# Patient Record
Sex: Female | Born: 1940 | Race: White | Hispanic: No | Marital: Single | Smoking: Former smoker
Health system: Southern US, Community
[De-identification: ages and names within clinical notes are randomized; demographics above are authoritative.]

## PROBLEM LIST (undated history)

## (undated) DIAGNOSIS — R258 Other abnormal involuntary movements: Secondary | ICD-10-CM

## (undated) DIAGNOSIS — R519 Headache, unspecified: Secondary | ICD-10-CM

## (undated) DIAGNOSIS — M79606 Pain in leg, unspecified: Secondary | ICD-10-CM

## (undated) DIAGNOSIS — R51 Headache: Secondary | ICD-10-CM

## (undated) DIAGNOSIS — E8770 Fluid overload, unspecified: Secondary | ICD-10-CM

## (undated) DIAGNOSIS — Z972 Presence of dental prosthetic device (complete) (partial): Secondary | ICD-10-CM

## (undated) DIAGNOSIS — I509 Heart failure, unspecified: Secondary | ICD-10-CM

## (undated) DIAGNOSIS — I10 Essential (primary) hypertension: Secondary | ICD-10-CM

## (undated) DIAGNOSIS — G4730 Sleep apnea, unspecified: Secondary | ICD-10-CM

## (undated) HISTORY — PX: ABDOMINAL HYSTERECTOMY: SHX81

## (undated) HISTORY — PX: COLONOSCOPY: SHX174

## (undated) HISTORY — PX: CATARACT EXTRACTION W/ INTRAOCULAR LENS  IMPLANT, BILATERAL: SHX1307

---

## 2018-03-24 ENCOUNTER — Encounter (HOSPITAL_COMMUNITY): Payer: Self-pay | Admitting: Internal Medicine

## 2018-03-24 ENCOUNTER — Ambulatory Visit (HOSPITAL_BASED_OUTPATIENT_CLINIC_OR_DEPARTMENT_OTHER)
Admission: RE | Admit: 2018-03-24 | Discharge: 2018-03-24 | Disposition: A | Discharge status: Home / Self Care | Payer: Medicare PPO | Source: Ambulatory Visit | Attending: Internal Medicine | Admitting: Internal Medicine

## 2018-03-24 ENCOUNTER — Inpatient Hospital Stay: Payer: Self-pay

## 2018-03-24 ENCOUNTER — Inpatient Hospital Stay (HOSPITAL_COMMUNITY)
Admission: AD | Admit: 2018-03-24 | Discharge: 2018-04-01 | DRG: 246 | Disposition: A | Discharge status: Home Health | Payer: Medicare PPO | Source: Ambulatory Visit | Attending: Internal Medicine | Admitting: Internal Medicine

## 2018-03-24 ENCOUNTER — Other Ambulatory Visit: Payer: Self-pay

## 2018-03-24 ENCOUNTER — Inpatient Hospital Stay (HOSPITAL_COMMUNITY): Payer: Medicare PPO

## 2018-03-24 VITALS — BP 176/94 | HR 60 | Wt 153.0 lb

## 2018-03-24 DIAGNOSIS — G253 Myoclonus: Secondary | ICD-10-CM | POA: Diagnosis not present

## 2018-03-24 DIAGNOSIS — R0600 Dyspnea, unspecified: Secondary | ICD-10-CM

## 2018-03-24 DIAGNOSIS — G255 Other chorea: Secondary | ICD-10-CM | POA: Diagnosis not present

## 2018-03-24 DIAGNOSIS — I272 Pulmonary hypertension, unspecified: Secondary | ICD-10-CM | POA: Diagnosis present

## 2018-03-24 DIAGNOSIS — E538 Deficiency of other specified B group vitamins: Secondary | ICD-10-CM | POA: Diagnosis not present

## 2018-03-24 DIAGNOSIS — I081 Rheumatic disorders of both mitral and tricuspid valves: Secondary | ICD-10-CM | POA: Diagnosis present

## 2018-03-24 DIAGNOSIS — G43909 Migraine, unspecified, not intractable, without status migrainosus: Secondary | ICD-10-CM | POA: Diagnosis present

## 2018-03-24 DIAGNOSIS — I5023 Acute on chronic systolic (congestive) heart failure: Secondary | ICD-10-CM | POA: Diagnosis present

## 2018-03-24 DIAGNOSIS — R0602 Shortness of breath: Secondary | ICD-10-CM

## 2018-03-24 DIAGNOSIS — I2510 Atherosclerotic heart disease of native coronary artery without angina pectoris: Secondary | ICD-10-CM | POA: Diagnosis not present

## 2018-03-24 DIAGNOSIS — L930 Discoid lupus erythematosus: Secondary | ICD-10-CM | POA: Diagnosis present

## 2018-03-24 DIAGNOSIS — R0603 Acute respiratory distress: Secondary | ICD-10-CM

## 2018-03-24 DIAGNOSIS — R768 Other specified abnormal immunological findings in serum: Secondary | ICD-10-CM | POA: Diagnosis present

## 2018-03-24 DIAGNOSIS — G40909 Epilepsy, unspecified, not intractable, without status epilepticus: Secondary | ICD-10-CM | POA: Diagnosis present

## 2018-03-24 DIAGNOSIS — I671 Cerebral aneurysm, nonruptured: Secondary | ICD-10-CM | POA: Diagnosis not present

## 2018-03-24 DIAGNOSIS — I429 Cardiomyopathy, unspecified: Secondary | ICD-10-CM | POA: Diagnosis not present

## 2018-03-24 DIAGNOSIS — I447 Left bundle-branch block, unspecified: Secondary | ICD-10-CM | POA: Diagnosis present

## 2018-03-24 DIAGNOSIS — G4733 Obstructive sleep apnea (adult) (pediatric): Secondary | ICD-10-CM | POA: Diagnosis present

## 2018-03-24 DIAGNOSIS — R259 Unspecified abnormal involuntary movements: Secondary | ICD-10-CM | POA: Diagnosis not present

## 2018-03-24 DIAGNOSIS — I480 Paroxysmal atrial fibrillation: Secondary | ICD-10-CM | POA: Diagnosis not present

## 2018-03-24 DIAGNOSIS — N179 Acute kidney failure, unspecified: Secondary | ICD-10-CM | POA: Diagnosis not present

## 2018-03-24 DIAGNOSIS — I10 Essential (primary) hypertension: Secondary | ICD-10-CM

## 2018-03-24 DIAGNOSIS — Z9989 Dependence on other enabling machines and devices: Secondary | ICD-10-CM

## 2018-03-24 DIAGNOSIS — I6523 Occlusion and stenosis of bilateral carotid arteries: Secondary | ICD-10-CM | POA: Diagnosis present

## 2018-03-24 DIAGNOSIS — T508X5A Adverse effect of diagnostic agents, initial encounter: Secondary | ICD-10-CM | POA: Diagnosis not present

## 2018-03-24 DIAGNOSIS — I6381 Other cerebral infarction due to occlusion or stenosis of small artery: Secondary | ICD-10-CM | POA: Diagnosis not present

## 2018-03-24 DIAGNOSIS — Z79899 Other long term (current) drug therapy: Secondary | ICD-10-CM

## 2018-03-24 DIAGNOSIS — Z955 Presence of coronary angioplasty implant and graft: Secondary | ICD-10-CM

## 2018-03-24 DIAGNOSIS — R29700 NIHSS score 0: Secondary | ICD-10-CM | POA: Diagnosis not present

## 2018-03-24 DIAGNOSIS — I340 Nonrheumatic mitral (valve) insufficiency: Secondary | ICD-10-CM | POA: Diagnosis not present

## 2018-03-24 DIAGNOSIS — R21 Rash and other nonspecific skin eruption: Secondary | ICD-10-CM | POA: Diagnosis present

## 2018-03-24 DIAGNOSIS — I5082 Biventricular heart failure: Secondary | ICD-10-CM | POA: Diagnosis present

## 2018-03-24 DIAGNOSIS — T420X5A Adverse effect of hydantoin derivatives, initial encounter: Secondary | ICD-10-CM | POA: Diagnosis not present

## 2018-03-24 DIAGNOSIS — I110 Hypertensive heart disease with heart failure: Secondary | ICD-10-CM | POA: Diagnosis present

## 2018-03-24 DIAGNOSIS — I639 Cerebral infarction, unspecified: Secondary | ICD-10-CM | POA: Diagnosis not present

## 2018-03-24 DIAGNOSIS — I5022 Chronic systolic (congestive) heart failure: Secondary | ICD-10-CM

## 2018-03-24 DIAGNOSIS — G254 Drug-induced chorea: Secondary | ICD-10-CM | POA: Diagnosis not present

## 2018-03-24 DIAGNOSIS — E871 Hypo-osmolality and hyponatremia: Secondary | ICD-10-CM | POA: Diagnosis present

## 2018-03-24 DIAGNOSIS — Z87891 Personal history of nicotine dependence: Secondary | ICD-10-CM

## 2018-03-24 HISTORY — DX: Fluid overload, unspecified: E87.70

## 2018-03-24 HISTORY — DX: Heart failure, unspecified: I50.9

## 2018-03-24 HISTORY — DX: Other abnormal involuntary movements: R25.8

## 2018-03-24 HISTORY — DX: Essential (primary) hypertension: I10

## 2018-03-24 HISTORY — DX: Pain in leg, unspecified: M79.606

## 2018-03-24 LAB — TSH: TSH: 1.488 u[IU]/mL (ref 0.350–4.500)

## 2018-03-24 LAB — COMPREHENSIVE METABOLIC PANEL
ALK PHOS: 162 U/L — AB (ref 38–126)
ALT: 21 U/L (ref 0–44)
ANION GAP: 8 (ref 5–15)
AST: 40 U/L (ref 15–41)
Albumin: 3.1 g/dL — ABNORMAL LOW (ref 3.5–5.0)
BUN: 12 mg/dL (ref 8–23)
CALCIUM: 8.3 mg/dL — AB (ref 8.9–10.3)
CO2: 26 mmol/L (ref 22–32)
CREATININE: 0.72 mg/dL (ref 0.44–1.00)
Chloride: 99 mmol/L (ref 98–111)
Glucose, Bld: 121 mg/dL — ABNORMAL HIGH (ref 70–99)
Potassium: 3.9 mmol/L (ref 3.5–5.1)
SODIUM: 133 mmol/L — AB (ref 135–145)
TOTAL PROTEIN: 7 g/dL (ref 6.5–8.1)
Total Bilirubin: 0.8 mg/dL (ref 0.3–1.2)

## 2018-03-24 LAB — CBC WITH DIFFERENTIAL/PLATELET
Abs Immature Granulocytes: 0 10*3/uL (ref 0.0–0.1)
BASOS ABS: 0 10*3/uL (ref 0.0–0.1)
BASOS PCT: 0 %
EOS PCT: 1 %
Eosinophils Absolute: 0 10*3/uL (ref 0.0–0.7)
HCT: 37.5 % (ref 36.0–46.0)
HEMOGLOBIN: 12.2 g/dL (ref 12.0–15.0)
Immature Granulocytes: 0 %
LYMPHS PCT: 22 %
Lymphs Abs: 0.6 10*3/uL — ABNORMAL LOW (ref 0.7–4.0)
MCH: 32.9 pg (ref 26.0–34.0)
MCHC: 32.5 g/dL (ref 30.0–36.0)
MCV: 101.1 fL — AB (ref 78.0–100.0)
MONOS PCT: 19 %
Monocytes Absolute: 0.6 10*3/uL (ref 0.1–1.0)
Neutro Abs: 1.7 10*3/uL (ref 1.7–7.7)
Neutrophils Relative %: 58 %
Platelets: 174 10*3/uL (ref 150–400)
RBC: 3.71 MIL/uL — ABNORMAL LOW (ref 3.87–5.11)
RDW: 15.1 % (ref 11.5–15.5)
WBC: 2.9 10*3/uL — ABNORMAL LOW (ref 4.0–10.5)

## 2018-03-24 LAB — TROPONIN I
Troponin I: <0.03 ng/mL (ref <0.03)
Troponin I: <0.03 ng/mL (ref <0.03)

## 2018-03-24 LAB — BRAIN NATRIURETIC PEPTIDE: B NATRIURETIC PEPTIDE 5: 2425.8 pg/mL — AB (ref 0.0–100.0)

## 2018-03-24 LAB — MAGNESIUM: MAGNESIUM: 1.9 mg/dL (ref 1.7–2.4)

## 2018-03-24 LAB — MRSA PCR SCREENING: MRSA BY PCR: NEGATIVE

## 2018-03-24 MED ORDER — PHENYTOIN SODIUM EXTENDED 100 MG PO CAPS
100.0000 mg | ORAL_CAPSULE | Freq: Three times a day (TID) | ORAL | Status: DC
  Administered 2018-03-24 – 2018-03-25 (×2): 100 mg via ORAL
  Filled 2018-03-24 (×3): qty 1

## 2018-03-24 MED ORDER — HYDROCORTISONE 1 % EX CREA
TOPICAL_CREAM | Freq: Two times a day (BID) | CUTANEOUS | Status: DC
  Administered 2018-03-24: 1 via TOPICAL
  Administered 2018-03-24 – 2018-03-25 (×2): via TOPICAL
  Administered 2018-03-25 – 2018-03-26 (×2): 1 via TOPICAL
  Administered 2018-03-26 – 2018-04-01 (×9): via TOPICAL
  Filled 2018-03-24: qty 28

## 2018-03-24 MED ORDER — POTASSIUM CHLORIDE CRYS ER 20 MEQ PO TBCR
40.0000 meq | EXTENDED_RELEASE_TABLET | Freq: Once | ORAL | Status: AC
  Administered 2018-03-24: 40 meq via ORAL
  Filled 2018-03-24: qty 2

## 2018-03-24 MED ORDER — TRAMADOL HCL 50 MG PO TABS
100.0000 mg | ORAL_TABLET | Freq: Two times a day (BID) | ORAL | Status: DC | PRN
  Administered 2018-03-24 – 2018-03-31 (×6): 100 mg via ORAL
  Filled 2018-03-24 (×6): qty 2

## 2018-03-24 MED ORDER — ACETAMINOPHEN 325 MG PO TABS
650.0000 mg | ORAL_TABLET | ORAL | Status: DC | PRN
  Administered 2018-03-24 – 2018-04-01 (×26): 650 mg via ORAL
  Filled 2018-03-24 (×26): qty 2

## 2018-03-24 MED ORDER — AMLODIPINE BESYLATE 10 MG PO TABS: 10.0000 mg | ORAL_TABLET | Freq: Every day | ORAL | Status: DC

## 2018-03-24 MED ORDER — AMLODIPINE BESYLATE 10 MG PO TABS
10.0000 mg | ORAL_TABLET | Freq: Every day | ORAL | Status: DC
  Administered 2018-03-24 – 2018-04-01 (×9): 10 mg via ORAL
  Filled 2018-03-24 (×9): qty 1

## 2018-03-24 MED ORDER — METOPROLOL SUCCINATE ER 100 MG PO TB24
100.0000 mg | ORAL_TABLET | Freq: Every day | ORAL | Status: DC
  Administered 2018-03-24 – 2018-03-25 (×2): 100 mg via ORAL
  Filled 2018-03-24 (×2): qty 1

## 2018-03-24 MED ORDER — SODIUM CHLORIDE 0.9% FLUSH: 3.0000 mL | INTRAVENOUS | Status: DC | PRN

## 2018-03-24 MED ORDER — ONDANSETRON HCL 4 MG/2ML IJ SOLN
4.0000 mg | Freq: Four times a day (QID) | INTRAMUSCULAR | Status: DC | PRN
  Administered 2018-03-26 – 2018-03-31 (×3): 4 mg via INTRAVENOUS
  Filled 2018-03-24 (×3): qty 2

## 2018-03-24 MED ORDER — FUROSEMIDE 10 MG/ML IJ SOLN
80.0000 mg | Freq: Two times a day (BID) | INTRAMUSCULAR | Status: DC
  Administered 2018-03-24 – 2018-03-26 (×5): 80 mg via INTRAVENOUS
  Filled 2018-03-24 (×5): qty 8

## 2018-03-24 MED ORDER — SODIUM CHLORIDE 0.9% FLUSH
3.0000 mL | Freq: Two times a day (BID) | INTRAVENOUS | Status: DC
  Administered 2018-03-24: 0.3 mL via INTRAVENOUS
  Administered 2018-03-25 – 2018-03-26 (×2): 3 mL via INTRAVENOUS

## 2018-03-24 MED ORDER — POTASSIUM CHLORIDE CRYS ER 10 MEQ PO TBCR
10.0000 meq | EXTENDED_RELEASE_TABLET | Freq: Every day | ORAL | Status: DC
  Administered 2018-03-24 – 2018-03-25 (×2): 10 meq via ORAL
  Filled 2018-03-24 (×4): qty 1

## 2018-03-24 MED ORDER — ENOXAPARIN SODIUM 40 MG/0.4ML ~~LOC~~ SOLN
40.0000 mg | SUBCUTANEOUS | Status: DC
  Administered 2018-03-24 – 2018-03-26 (×3): 40 mg via SUBCUTANEOUS
  Filled 2018-03-24 (×3): qty 0.4

## 2018-03-24 MED ORDER — SPIRONOLACTONE 25 MG PO TABS
25.0000 mg | ORAL_TABLET | Freq: Every day | ORAL | Status: DC
  Administered 2018-03-24 – 2018-04-01 (×9): 25 mg via ORAL
  Filled 2018-03-24 (×9): qty 1

## 2018-03-24 MED ORDER — SACUBITRIL-VALSARTAN 97-103 MG PO TABS
1.0000 | ORAL_TABLET | Freq: Two times a day (BID) | ORAL | Status: DC
  Administered 2018-03-24 – 2018-03-31 (×16): 1 via ORAL
  Filled 2018-03-24 (×17): qty 1

## 2018-03-24 MED ORDER — SODIUM CHLORIDE 0.9 % IV SOLN: 250.0000 mL | INTRAVENOUS | Status: DC | PRN

## 2018-03-24 NOTE — Progress Notes (Signed)
Orthopedic Tech Progress Note Patient Details:  Sharlyn BolognaDeanne Schauer July 14, 1941 [id number]  Ortho Devices Type of Ortho Device: Radio broadcast assistantUnna boot Ortho Device/Splint Location: bilateral Ortho Device/Splint Interventions: Application   Post Interventions Patient Tolerated: Well Instructions Provided: Care of device   Nikki DomCrawford, Woodford Strege 03/24/2018, 6:19 PM

## 2018-03-24 NOTE — H&P (Signed)
ADVANCED HF CLINIC NEW PATIENT NOTE   Primary Cardiologist: None  HPI:  77 y/o woman (mother of Gwendolyn Fill) with h/o HTN and recently diagnosed systolic HF presents as a new patient for further evaluation of recent-onset systolic HF.   Previously worked as Nutritional therapist at [employer]. Has long h/o HTN. Was fine until April of this year when she developed severe ankle swelling followed by exertional dyspnea. In May she developed respiratory failure with orthopnea and PND.  Went to urgent care and transferred to the [facility].   Limited notes in Care Everywhere which we reviewed.  On arrival SBP was > 200. Echo as below showed EF ~40% with 2-3+ MR. Inferior HK. She was diuresed about 13 pounds. Also fund to have OSA with AHI 23 started on CPAP. Renal artery u/s without evidence of RAS. Meds adjusted to get BP down and was started on clonidine 0.3 patch  Since d/c has done very poorly. Still with severe LE edema which is getting worse and now into thighs despite bumex 5mg  daily. Very limited functional capacity. Used to take care of 3 acre yard and now struggling with ADLs. Sleeps on 2 pillows. Mild bendopnea. Very fatigued with clonidine   On oxygen at night with CPAP. No h/o tobacco use. No DVT/PE  Studies:   The left ventricular ejection fraction is moderately reduced (~40%) with inferior HK  Flattened septum is consistent with right ventricular pressure overload.  There is normal left ventricular wall thickness.  The aortic valve is trileaflet with thin, pliable leaflets that move  normally.  There is moderate to moderately severe (2-3+) mitral regurgitation.  Septal motion is consistent with conduction abnormality.  The left atrium is severely dilated.  The right atrium is severely dilated.  There is moderate (2+) tricuspid regurgitation.  Right ventricular systolic pressure is elevated between 40-52mm Hg,  consistent with moderate pulmonary hypertension.  There is a  small size pericardial effusion.    Review of Systems: [y] = yes, [ ]  = no   General: Weight gain [ y]; Weight loss [ ] ; Anorexia Cove.Etienne ]; Fatigue [ y]; Fever [ ] ; Chills [ ] ; Weakness Cove.Etienne ]  Cardiac: Chest pain/pressure [ ] ; Resting SOB [ ] ; Exertional SOB [ y]; Orthopnea [ ] ; Pedal Edema Cove.Etienne ]; Palpitations [ ] ; Syncope [ ] ; Presyncope [ ] ; Paroxysmal nocturnal dyspnea[ ]   Pulmonary: Cough [ ] ; Wheezing[ ] ; Hemoptysis[ ] ; Sputum [ ] ; Snoring [ ]   GI: Vomiting[ ] ; Dysphagia[ ] ; Melena[ ] ; Hematochezia [ ] ; Heartburn[ ] ; Abdominal pain [ ] ; Constipation [ ] ; Diarrhea [ ] ; BRBPR [ ]   GU: Hematuria[ ] ; Dysuria [ ] ; Nocturia[ ]   Vascular: Pain in legs with walking Cove.Etienne ]; Pain in feet with lying flat [ ] ; Non-healing sores [ ] ; Stroke [ ] ; TIA [ ] ; Slurred speech [ ] ;  Neuro: Headaches[ ] ; Vertigo[ ] ; Seizures[ ] ; Paresthesias[ ] ;Blurred vision [ ] ; Diplopia [ ] ; Vision changes [ ]   Ortho/Skin: Arthritis Cove.Etienne ]; Joint pain [ y]; Muscle pain [ ] ; Joint swelling [ ] ; Back Pain [ ] ; Rash [ ]   Psych: Depression[ ] ; Anxiety[ ]   Heme: Bleeding problems [ ] ; Clotting disorders [ ] ; Anemia [ ]   Endocrine: Diabetes [ ] ; Thyroid dysfunction[ ]    PMHx: 1. HTN 2. Systolic HF  Current Outpatient Medications  Medication Sig Dispense Refill  . amLODipine (NORVASC) 10 MG tablet Take 10 mg by mouth daily.    . bumetanide (BUMEX) 0.5 MG tablet Take  0.5 mg by mouth 2 (two) times daily.  2  . calcium carbonate (TUMS - DOSED IN MG ELEMENTAL CALCIUM) 500 MG chewable tablet Chew 2 tablets by mouth daily.    . candesartan (ATACAND) 32 MG tablet Take 32 mg by mouth daily.    . cloNIDine (CATAPRES - DOSED IN MG/24 HR) 0.3 mg/24hr patch 0.3 mg 3 (three) times daily.  1  . magnesium oxide (MAG-OX) 400 MG tablet Take 1 tablet by mouth daily.  0  . metoprolol succinate (TOPROL-XL) 100 MG 24 hr tablet Take 100 mg by mouth daily. Take with or immediately following a meal.    . phenytoin (DILANTIN) 100 MG ER capsule Take 100 mg  by mouth 3 (three) times daily.    . potassium chloride (K-DUR) 10 MEQ tablet Take 10 mEq by mouth daily.  0  . spironolactone (ALDACTONE) 25 MG tablet Take 25 mg by mouth daily.     No current facility-administered medications for this encounter.     Not on File    Social History   Socioeconomic History  . Marital status: Single    Spouse name: Not on file  . Number of children: Not on file  . Years of education: Not on file  . Highest education level: Not on file  Occupational History  . Not on file  Social Needs  . Financial resource strain: Not on file  . Food insecurity:    Worry: Not on file    Inability: Not on file  . Transportation needs:    Medical: Not on file    Non-medical: Not on file  Tobacco Use  . Smoking status: Former Games developermoker  . Smokeless tobacco: Never Used  Substance and Sexual Activity  . Alcohol use: Not on file  . Drug use: Not on file  . Sexual activity: Not on file  Lifestyle  . Physical activity:    Days per week: Not on file    Minutes per session: Not on file  . Stress: Not on file  Relationships  . Social connections:    Talks on phone: Not on file    Gets together: Not on file    Attends religious service: Not on file    Active member of club or organization: Not on file    Attends meetings of clubs or organizations: Not on file    Relationship status: Not on file  . Intimate partner violence:    Fear of current or ex partner: Not on file    Emotionally abused: Not on file    Physically abused: Not on file    Forced sexual activity: Not on file  Other Topics Concern  . Not on file  Social History Narrative  . Not on file    FHx: Father died from MVA Mother had HTN. Died at 6292 Grandmother had HF No family h/o premature CAD  Vitals:   03/24/18 1103  BP: (!) 176/94  Pulse: 60  SpO2: 97%  Weight: 153 lb (69.4 kg)    PHYSICAL EXAM: General:  Fatigued appearing. No respiratory difficulty HEENT: normal patchy crusted  rash on face and neck Neck: supple. JVP to ear with prominent CV Waves Carotids 2+ bilat; no bruits. No lymphadenopathy or thryomegaly appreciated. Cor: PMI nondisplaced. Regular rate & rhythm. 2/6 TR 2/6 PI. I do not hear MR Lungs: clear Abdomen: soft, nontender, nondistended. No hepatosplenomegaly. No bruits or masses. Good bowel sounds. Extremities: no cyanosis, clubbing, rash, 3-4+ edema into thighs Neuro: alert &  oriented x 3, cranial nerves grossly intact. moves all 4 extremities w/o difficulty. Affect pleasant.  ECG: pending   ASSESSMENT & PLAN: 1. 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 Differential also includes ischemic CM, infiltrative CM (amyloid) and CTD-related PAH/HF - on exam now with massive volume overload and NYHA IIIB-IV symptoms - will admit for IV diuresis and further w/u. Will need repeat echo, cMRI and R/L cath - place PICC to check CVP and co-ox - place UNNA boots - switch candesartan to Entresto - Continue spiro, Toprol  - stop clonidine   2. Severe HTN - switch candesartan to Entresto - Continue spiro, Toprol  - stop clonidine  - PRN IV hydralazine for SBP > 160  3. Facial rash - reviewed with Dermatology. 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 LBBB  - check ECG.  5. OSA - continue CPAP  Arvilla Meres, MD  12:47 PM

## 2018-03-24 NOTE — Progress Notes (Addendum)
Marland Kitchen.   ADVANCED HF TEAM H&P   Primary Cardiologist: None  HPI:  77 y/o woman (mother of Gwendolyn FillSheryl Booth) with h/o HTN and recently diagnosed systolic HF presents as a new patient for further evaluation of recent-onset systolic HF.   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 Has long h/o HTN. Was fine until April of this year when she developed severe ankle swelling followed by exertional dyspnea. In May she developed respiratory failure with orthopnea and PND.  [facility] to [facility]   Limited notes in Care Everywhere which we reviewed.  On arrival SBP was > 200. Echo as below showed EF ~40% with 2-3+ MR. Inferior HK. She was diuresed about 13 pounds. Also fund to have OSA with AHI 23 started on CPAP. Renal artery u/s without evidence of RAS. Meds adjusted to get BP down and was started on clonidine 0.3 patch  Since d/c has done very poorly. Still with severe LE edema which is getting worse and now into thighs despite bumex 5mg  daily. Very limited functional capacity. Used to take care of 3 acre yard and now struggling with ADLs. Sleeps on 2 pillows. Mild bendopnea. Very fatigued with clonidine   On oxygen at night with CPAP. No h/o tobacco use. No DVT/PE  Studies:   The left ventricular ejection fraction is moderately reduced (~40%) with inferior HK  Flattened septum is consistent with right ventricular pressure overload.  There is normal left ventricular wall thickness.  The aortic valve is trileaflet with thin, pliable leaflets that move  normally.  There is moderate to moderately severe (2-3+) mitral regurgitation.  Septal motion is consistent with conduction abnormality.  The left atrium is severely dilated.  The right atrium is severely dilated.  There is moderate (2+) tricuspid regurgitation.  Right ventricular systolic pressure is elevated between 40-3250mm Hg,  consistent with moderate pulmonary hypertension.  There is a small size  pericardial effusion.    Review of Systems: [y] = yes, [ ]  = no   General: Weight gain [ y]; Weight loss [ ] ; Anorexia Cove.Etienne[y ]; Fatigue [ y]; Fever [ ] ; Chills [ ] ; Weakness Cove.Etienne[y ]  Cardiac: Chest pain/pressure [ ] ; Resting SOB [ ] ; Exertional SOB [ y]; Orthopnea [ ] ; Pedal Edema Cove.Etienne[y ]; Palpitations [ ] ; Syncope [ ] ; Presyncope [ ] ; Paroxysmal nocturnal dyspnea[ ]   Pulmonary: Cough [ ] ; Wheezing[ ] ; Hemoptysis[ ] ; Sputum [ ] ; Snoring [ ]   GI: Vomiting[ ] ; Dysphagia[ ] ; Melena[ ] ; Hematochezia [ ] ; Heartburn[ ] ; Abdominal pain [ ] ; Constipation [ ] ; Diarrhea [ ] ; BRBPR [ ]   GU: Hematuria[ ] ; Dysuria [ ] ; Nocturia[ ]   Vascular: Pain in legs with walking Cove.Etienne[y ]; Pain in feet with lying flat [ ] ; Non-healing sores [ ] ; Stroke [ ] ; TIA [ ] ; Slurred speech [ ] ;  Neuro: Headaches[ ] ; Vertigo[ ] ; Seizures[ ] ; Paresthesias[ ] ;Blurred vision [ ] ; Diplopia [ ] ; Vision changes [ ]   Ortho/Skin: Arthritis Cove.Etienne[y ]; Joint pain [ y]; Muscle pain [ ] ; Joint swelling [ ] ; Back Pain [ ] ; Rash [ ]   Psych: Depression[ ] ; Anxiety[ ]   Heme: Bleeding problems [ ] ; Clotting disorders [ ] ; Anemia [ ]   Endocrine: Diabetes [ ] ; Thyroid dysfunction[ ]    PMHx: 1. HTN 2. Systolic HF  Current Outpatient Medications  Medication Sig Dispense Refill  . amLODipine (NORVASC) 10 MG tablet Take 10 mg by mouth daily.    . bumetanide (BUMEX) 0.5 MG tablet Take 0.5  mg by mouth 2 (two) times daily.  2  . calcium carbonate (TUMS - DOSED IN MG ELEMENTAL CALCIUM) 500 MG chewable tablet Chew 2 tablets by mouth daily.    . candesartan (ATACAND) 32 MG tablet Take 32 mg by mouth daily.    . cloNIDine (CATAPRES - DOSED IN MG/24 HR) 0.3 mg/24hr patch 0.3 mg 3 (three) times daily.  1  . magnesium oxide (MAG-OX) 400 MG tablet Take 1 tablet by mouth daily.  0  . metoprolol succinate (TOPROL-XL) 100 MG 24 hr tablet Take 100 mg by mouth daily. Take with or immediately following a meal.    . phenytoin (DILANTIN) 100 MG ER capsule Take 100 mg by mouth 3  (three) times daily.    . potassium chloride (K-DUR) 10 MEQ tablet Take 10 mEq by mouth daily.  0  . spironolactone (ALDACTONE) 25 MG tablet Take 25 mg by mouth daily.     No current facility-administered medications for this encounter.     Not on File    Social History   Socioeconomic History  . Marital status: Single    Spouse name: Not on file  . Number of children: Not on file  . Years of education: Not on file  . Highest education level: Not on file  Occupational History  . Not on file  Social Needs  . Financial resource strain: Not on file  . Food insecurity:    Worry: Not on file    Inability: Not on file  . Transportation needs:    Medical: Not on file    Non-medical: Not on file  Tobacco Use  . Smoking status: Former Games developer  . Smokeless tobacco: Never Used  Substance and Sexual Activity  . Alcohol use: Not on file  . Drug use: Not on file  . Sexual activity: Not on file  Lifestyle  . Physical activity:    Days per week: Not on file    Minutes per session: Not on file  . Stress: Not on file  Relationships  . Social connections:    Talks on phone: Not on file    Gets together: Not on file    Attends religious service: Not on file    Active member of club or organization: Not on file    Attends meetings of clubs or organizations: Not on file    Relationship status: Not on file  . Intimate partner violence:    Fear of current or ex partner: Not on file    Emotionally abused: Not on file    Physically abused: Not on file    Forced sexual activity: Not on file  Other Topics Concern  . Not on file  Social History Narrative  . Not on file    FHx: Father died from MVA Mother had HTN. Died at 60 Grandmother had HF No family h/o premature CAD  Vitals:   03/24/18 1103  BP: (!) 176/94  Pulse: 60  SpO2: 97%  Weight: 153 lb (69.4 kg)    PHYSICAL EXAM: General:  Fatigued appearing. No respiratory difficulty HEENT: normal patchy crusted rash on face  and neck Neck: supple. JVP to ear with prominent CV Waves Carotids 2+ bilat; no bruits. No lymphadenopathy or thryomegaly appreciated. Cor: PMI nondisplaced. Regular rate & rhythm. 2/6 TR 2/6 PI. I do not hear MR Lungs: clear Abdomen: soft, nontender, nondistended. No hepatosplenomegaly. No bruits or masses. Good bowel sounds. Extremities: no cyanosis, clubbing, rash, 3-4+ edema into thighs Neuro: alert & oriented  x 3, cranial nerves grossly intact. moves all 4 extremities w/o difficulty. Affect pleasant.  ECG: pending   ASSESSMENT & PLAN: 1. 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 Differential also includes ischemic CM, infiltrative CM (amyloid) and CTD-related PAH/HF - on exam now with massive volume overload and NYHA IIIB-IV symptoms - will admit for IV diuresis and further w/u. Will need repeat echo, cMRI and R/L cath - place PICC to check CVP and co-ox - place UNNA boots - switch candesartan to Entresto - Continue spiro, Toprol  - stop clonidine   2. Severe HTN - switch candesartan to Entresto - Continue spiro, Toprol  - stop clonidine  - PRN IV hydralazine for SBP > 160  3. Facial rash - reviewed with Dermatology. 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 LBBB  - check ECG.  5. OSA - continue CPAP  Arvilla Meres, MD  12:47 PM

## 2018-03-24 NOTE — Progress Notes (Signed)
IV team called back and claimed that PICC Line will be inserted tom.

## 2018-03-25 ENCOUNTER — Other Ambulatory Visit: Payer: Self-pay

## 2018-03-25 ENCOUNTER — Inpatient Hospital Stay (HOSPITAL_COMMUNITY): Payer: Medicare PPO

## 2018-03-25 ENCOUNTER — Encounter (HOSPITAL_COMMUNITY): Payer: Self-pay | Admitting: *Deleted

## 2018-03-25 DIAGNOSIS — I340 Nonrheumatic mitral (valve) insufficiency: Secondary | ICD-10-CM

## 2018-03-25 DIAGNOSIS — I447 Left bundle-branch block, unspecified: Secondary | ICD-10-CM

## 2018-03-25 LAB — URINALYSIS, ROUTINE W REFLEX MICROSCOPIC
BILIRUBIN URINE: NEGATIVE
GLUCOSE, UA: NEGATIVE mg/dL
HGB URINE DIPSTICK: NEGATIVE
KETONES UR: NEGATIVE mg/dL
Leukocytes, UA: NEGATIVE
NITRITE: NEGATIVE
PH: 8 (ref 5.0–8.0)
Protein, ur: NEGATIVE mg/dL
SPECIFIC GRAVITY, URINE: 1.004 — AB (ref 1.005–1.030)

## 2018-03-25 LAB — BASIC METABOLIC PANEL
ANION GAP: 10 (ref 5–15)
BUN: 11 mg/dL (ref 8–23)
CO2: 27 mmol/L (ref 22–32)
Calcium: 8.3 mg/dL — ABNORMAL LOW (ref 8.9–10.3)
Chloride: 95 mmol/L — ABNORMAL LOW (ref 98–111)
Creatinine, Ser: 0.66 mg/dL (ref 0.44–1.00)
GFR calc Af Amer: >60 mL/min (ref >60)
GLUCOSE: 104 mg/dL — AB (ref 70–99)
POTASSIUM: 4 mmol/L (ref 3.5–5.1)
Sodium: 132 mmol/L — ABNORMAL LOW (ref 135–145)

## 2018-03-25 LAB — RHEUMATOID FACTOR: Rhuematoid fact SerPl-aCnc: <10 IU/mL (ref 0.0–13.9)

## 2018-03-25 LAB — POTASSIUM: Potassium: 3.6 mmol/L (ref 3.5–5.1)

## 2018-03-25 LAB — COOXEMETRY PANEL
Carboxyhemoglobin: 1.3 % (ref 0.5–1.5)
METHEMOGLOBIN: 0.8 % (ref 0.0–1.5)
O2 Saturation: 49.7 %
Total hemoglobin: 14.1 g/dL (ref 12.0–16.0)

## 2018-03-25 LAB — ECHOCARDIOGRAM COMPLETE
HEIGHTINCHES: 66 in
WEIGHTICAEL: 2320 [oz_av]

## 2018-03-25 LAB — TROPONIN I: Troponin I: <0.03 ng/mL (ref <0.03)

## 2018-03-25 LAB — ANA: Anti Nuclear Antibody(ANA): POSITIVE — AB

## 2018-03-25 LAB — MAGNESIUM: Magnesium: 1.7 mg/dL (ref 1.7–2.4)

## 2018-03-25 MED ORDER — METOPROLOL SUCCINATE ER 100 MG PO TB24: 200.0000 mg | ORAL_TABLET | Freq: Every day | ORAL | Status: DC

## 2018-03-25 MED ORDER — HYDRALAZINE HCL 50 MG PO TABS
25.0000 mg | ORAL_TABLET | Freq: Three times a day (TID) | ORAL | Status: DC
  Administered 2018-03-25 – 2018-03-27 (×8): 25 mg via ORAL
  Filled 2018-03-25 (×9): qty 1

## 2018-03-25 MED ORDER — MEPERIDINE HCL 25 MG/ML IJ SOLN
12.5000 mg | Freq: Once | INTRAMUSCULAR | Status: AC
  Administered 2018-03-25: 12.5 mg via INTRAVENOUS
  Filled 2018-03-25: qty 1

## 2018-03-25 MED ORDER — PHENYTOIN SODIUM EXTENDED 100 MG PO CAPS
100.0000 mg | ORAL_CAPSULE | Freq: Every day | ORAL | Status: DC | PRN
  Administered 2018-03-25: 100 mg via ORAL
  Filled 2018-03-25 (×3): qty 1

## 2018-03-25 MED ORDER — PHENYTOIN SODIUM EXTENDED 100 MG PO CAPS
100.0000 mg | ORAL_CAPSULE | ORAL | Status: DC
  Filled 2018-03-25: qty 1

## 2018-03-25 MED ORDER — POTASSIUM CHLORIDE CRYS ER 20 MEQ PO TBCR
40.0000 meq | EXTENDED_RELEASE_TABLET | Freq: Two times a day (BID) | ORAL | Status: DC
  Administered 2018-03-25 – 2018-03-28 (×6): 40 meq via ORAL
  Filled 2018-03-25 (×6): qty 2

## 2018-03-25 MED ORDER — PHENYTOIN SODIUM EXTENDED 100 MG PO CAPS
100.0000 mg | ORAL_CAPSULE | Freq: Three times a day (TID) | ORAL | Status: DC
  Administered 2018-03-25 – 2018-03-28 (×10): 100 mg via ORAL
  Filled 2018-03-25 (×12): qty 1

## 2018-03-25 MED ORDER — MAGNESIUM SULFATE IN D5W 1-5 GM/100ML-% IV SOLN
1.0000 g | Freq: Once | INTRAVENOUS | Status: AC
  Administered 2018-03-25: 1 g via INTRAVENOUS
  Filled 2018-03-25: qty 100

## 2018-03-25 MED ORDER — METOPROLOL SUCCINATE ER 100 MG PO TB24: 100.0000 mg | ORAL_TABLET | Freq: Every evening | ORAL | Status: DC

## 2018-03-25 MED ORDER — PHENYTOIN SODIUM EXTENDED 100 MG PO CAPS
200.0000 mg | ORAL_CAPSULE | Freq: Every day | ORAL | Status: DC
  Filled 2018-03-25: qty 2

## 2018-03-25 MED ORDER — HYDRALAZINE HCL 20 MG/ML IJ SOLN
10.0000 mg | INTRAMUSCULAR | Status: DC | PRN
  Administered 2018-03-25 – 2018-03-28 (×5): 10 mg via INTRAVENOUS
  Filled 2018-03-25 (×5): qty 1

## 2018-03-25 MED ORDER — LORAZEPAM 0.5 MG PO TABS
0.2500 mg | ORAL_TABLET | Freq: Once | ORAL | Status: AC
  Administered 2018-03-25: 0.25 mg via ORAL
  Filled 2018-03-25: qty 1

## 2018-03-25 MED ORDER — SODIUM CHLORIDE 0.9% FLUSH
10.0000 mL | Freq: Two times a day (BID) | INTRAVENOUS | Status: DC
  Administered 2018-03-25 – 2018-03-31 (×13): 10 mL

## 2018-03-25 MED ORDER — SODIUM CHLORIDE 0.9% FLUSH: 10.0000 mL | INTRAVENOUS | Status: DC | PRN

## 2018-03-25 MED ORDER — POTASSIUM CHLORIDE CRYS ER 20 MEQ PO TBCR
20.0000 meq | EXTENDED_RELEASE_TABLET | Freq: Two times a day (BID) | ORAL | Status: DC
  Administered 2018-03-25 – 2018-03-26 (×2): 20 meq via ORAL
  Filled 2018-03-25 (×2): qty 1

## 2018-03-25 NOTE — Progress Notes (Signed)
Advanced Heart Failure Rounding Note   Subjective:    Remains weak. Responding well to IV lasix. Weight down about 10 pounds. Breathing better. Denies orthopnea or PND. PICC placed. Co-ox 50%. BP remains very high.    Objective:   Weight Range:  Vital Signs:   Temp:  [97.4 F (36.3 C)-98.8 F (37.1 C)] 97.9 F (36.6 C) (07/27 1100) Pulse Rate:  [41-70] 65 (07/27 0400) Resp:  [16-26] 16 (07/27 0400) BP: (169-178)/(66-93) 178/93 (07/27 1100) SpO2:  [94 %-100 %] 96 % (07/27 0829) Weight:  [65.8 kg (145 lb)-69.4 kg (153 lb)] 65.8 kg (145 lb) (07/27 0637) Last BM Date: 03/23/18  Weight change: Filed Weights   03/24/18 1530 03/25/18 0637  Weight: 69.4 kg (153 lb) 65.8 kg (145 lb)    Intake/Output:   Intake/Output Summary (Last 24 hours) at 03/25/2018 1256 Last data filed at 03/25/2018 1255 Gross per 24 hour  Intake 200 ml  Output 6450 ml  Net -6250 ml     Physical Exam: General:  Elderly weak apperaing. No resp difficulty HEENT: normal Neck: supple. JVP 12 . Carotids 2+ bilat; no bruits. No lymphadenopathy or thryomegaly appreciated. Cor: PMI laterally displaced. Regular rate & rhythm. No rubs, gallops or murmurs. Lungs: clear Abdomen: soft, nontender, nondistended. No hepatosplenomegaly. No bruits or masses. Good bowel sounds. Extremities: no cyanosis, clubbing, rash, 2+ edema + UNNA boots Neuro: alert & orientedx3, cranial nerves grossly intact. moves all 4 extremities w/o difficulty. Affect pleasant  Telemetry: NSR 60s LBBB. Personally reviewed   Labs: Basic Metabolic Panel: Recent Labs  Lab 03/24/18 1457 03/25/18 0211  NA 133* 132*  K 3.9 4.0  CL 99 95*  CO2 26 27  GLUCOSE 121* 104*  BUN 12 11  CREATININE 0.72 0.66  CALCIUM 8.3* 8.3*  MG 1.9  --     Liver Function Tests: Recent Labs  Lab 03/24/18 1457  AST 40  ALT 21  ALKPHOS 162*  BILITOT 0.8  PROT 7.0  ALBUMIN 3.1*   No results for input(s): LIPASE, AMYLASE in the last 168 hours. No  results for input(s): AMMONIA in the last 168 hours.  CBC: Recent Labs  Lab 03/24/18 1457  WBC 2.9*  NEUTROABS 1.7  HGB 12.2  HCT 37.5  MCV 101.1*  PLT 174    Cardiac Enzymes: Recent Labs  Lab 03/24/18 1457 03/24/18 2031 03/25/18 0211  TROPONINI <0.03 <0.03 <0.03    BNP: BNP (last 3 results) Recent Labs    03/24/18 1457  BNP 2,425.8*    ProBNP (last 3 results) No results for input(s): PROBNP in the last 8760 hours.    Other results:  Imaging: Dg Chest Port 1 View  Result Date: 03/24/2018 CLINICAL DATA:  Shortness of breath. EXAM: PORTABLE CHEST 1 VIEW COMPARISON:  None. FINDINGS: Cardiomegaly identified. There is no evidence of focal airspace disease, pulmonary edema, suspicious pulmonary nodule/mass, pleural effusion, or pneumothorax. No acute bony abnormalities are identified. IMPRESSION: Cardiomegaly without evidence of acute cardiopulmonary disease. Electronically Signed   By: Harmon Pier M.D.   On: 03/24/2018 20:05   [place] Ekg Site Rite  Result Date: 03/24/2018 If Site Rite image not attached, placement could not be confirmed due to current cardiac rhythm.     Medications:     Scheduled Medications: . amLODipine  10 mg Oral Daily  . enoxaparin (LOVENOX) injection  40 mg Subcutaneous Q24H  . furosemide  80 mg Intravenous BID  . hydrocortisone cream   Topical BID  . [START  ON 03/26/2018] metoprolol succinate  100 mg Oral QPM  . [START ON 03/26/2018] metoprolol succinate  200 mg Oral QAC breakfast  . phenytoin  100 mg Oral TID  . potassium chloride  10 mEq Oral Daily  . sacubitril-valsartan  1 tablet Oral BID  . sodium chloride flush  10-40 mL Intracatheter Q12H  . sodium chloride flush  3 mL Intravenous Q12H  . spironolactone  25 mg Oral Daily     Infusions: . sodium chloride       PRN Medications:  sodium chloride, acetaminophen, ondansetron (ZOFRAN) IV, phenytoin, sodium chloride flush, sodium chloride flush, traMADol   Assessment:   77  y/o woman admitted 7/26 with acute on chronic systolic HF  Plan/Discussion:    1. 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 Differential also includes ischemic CM, infiltrative CM (amyloid) and CTD-related PAH/HF - admitted with massive volume overload and NYHA IIIB-IV symptoms - responding well to IV lasix. Weight down almost 10 pounds  - PICC now in.  - Co-ox 50% c/w low output. But diuresing well and renal function stable. Will not add inotropes at this point. Will cut b-blocker.  - continue UNNA boots - BP remains high.  - Continue Entresto 97/103.  - Stop toprol. Switch to carvedilol 3.125 bid - Continue spiro. - Continue amlodipine for now.  - Add hydralazine 25 tid - R/L cath probably Monday - Likely will also need cMRI  2. Severe HTN - Remains elevated. - Continue Entresto 97/103 bid - Continue spiro and amlodipine - Add hydralazine 25 tid - PRN IV hydralazine for SBP > 160  3. Facial rash - reviewed with Dermatology. 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 LBBB  - consider CRT as needed  5. OSA - continue CPAP  6. Seizure d/o - continue dilantin  Length of Stay: 1   [name] Meresaniel Lachele Lievanos MD 03/25/2018, 12:56 PM  Advanced Heart Failure Team Pager [phone] (M-F; 7a - 4p)  Please contact [facility] for night-coverage after hours (4p -7a ) and weekends on [email or web address]

## 2018-03-25 NOTE — Progress Notes (Signed)
Notified RN that plan to place PICC at 1100 this am.  RN to speak wih MD re antianxiety medication per pt request due to claustrophobia.

## 2018-03-25 NOTE — Progress Notes (Signed)
Peripherally Inserted Central Catheter/Midline Placement  The IV Nurse has discussed with the patient and/or persons authorized to consent for the patient, the purpose of this procedure and the potential benefits and risks involved with this procedure.  The benefits include less needle sticks, lab draws from the catheter, and the patient may be discharged home with the catheter. Risks include, but not limited to, infection, bleeding, blood clot (thrombus formation), and puncture of an artery; nerve damage and irregular heartbeat and possibility to perform a PICC exchange if needed/ordered by physician.  Alternatives to this procedure were also discussed.  Bard Power PICC patient education guide, fact sheet on infection prevention and patient information card has been provided to patient /or left at bedside.    PICC/Midline Placement Documentation  PICC Double Lumen 03/25/18 PICC Right Brachial 35 cm 0 cm (Active)  Indication for Insertion or Continuance of Line Prolonged intravenous therapies 03/25/2018 12:07 PM  Exposed Catheter (cm) 0 cm 03/25/2018 12:07 PM  Site Assessment Clean;Dry;Intact 03/25/2018 12:07 PM  Lumen #1 Status Flushed;Blood return noted 03/25/2018 12:07 PM  Lumen #2 Status Flushed;Blood return noted 03/25/2018 12:07 PM  Dressing Type Transparent 03/25/2018 12:07 PM  Dressing Status Clean;Dry;Intact;Antimicrobial disc in place 03/25/2018 12:07 PM  Dressing Intervention New dressing 03/25/2018 12:07 PM  Dressing Change Due 04/01/18 03/25/2018 12:07 PM       Reginia FortsLumban, Amira Podolak Albarece 03/25/2018, 12:08 PM

## 2018-03-25 NOTE — Progress Notes (Signed)
Pt using CPAP from home.

## 2018-03-25 NOTE — Progress Notes (Signed)
  Echocardiogram 2D Echocardiogram has been performed.  [name] Rosales G [name] 03/25/2018, 11:39 AM

## 2018-03-26 LAB — COOXEMETRY PANEL
Carboxyhemoglobin: 1.7 % — ABNORMAL HIGH (ref 0.5–1.5)
METHEMOGLOBIN: 0.9 % (ref 0.0–1.5)
O2 SAT: 72.8 %
Total hemoglobin: 13.7 g/dL (ref 12.0–16.0)

## 2018-03-26 LAB — BASIC METABOLIC PANEL
Anion gap: 10 (ref 5–15)
BUN: 10 mg/dL (ref 8–23)
CO2: 27 mmol/L (ref 22–32)
Calcium: 8.1 mg/dL — ABNORMAL LOW (ref 8.9–10.3)
Chloride: 92 mmol/L — ABNORMAL LOW (ref 98–111)
Creatinine, Ser: 0.79 mg/dL (ref 0.44–1.00)
GFR calc non Af Amer: >60 mL/min (ref >60)
GLUCOSE: 153 mg/dL — AB (ref 70–99)
POTASSIUM: 3.9 mmol/L (ref 3.5–5.1)
Sodium: 129 mmol/L — ABNORMAL LOW (ref 135–145)

## 2018-03-26 MED ORDER — SODIUM CHLORIDE 0.9 % IV SOLN: 250.0000 mL | INTRAVENOUS | Status: DC | PRN

## 2018-03-26 MED ORDER — HEPARIN BOLUS VIA INFUSION
1000.0000 [IU] | Freq: Once | INTRAVENOUS | Status: AC
Start: 2018-03-26 — End: 2018-03-26
  Administered 2018-03-26: 1000 [IU] via INTRAVENOUS
  Filled 2018-03-26: qty 1000

## 2018-03-26 MED ORDER — NITROGLYCERIN 0.4 MG SL SUBL
0.4000 mg | SUBLINGUAL_TABLET | SUBLINGUAL | Status: DC | PRN
  Administered 2018-03-29: 0.4 mg via SUBLINGUAL
  Filled 2018-03-26 (×2): qty 1

## 2018-03-26 MED ORDER — MEPERIDINE HCL 25 MG/ML IJ SOLN
12.5000 mg | Freq: Once | INTRAMUSCULAR | Status: AC
  Administered 2018-03-26: 12.5 mg via INTRAVENOUS
  Filled 2018-03-26: qty 1

## 2018-03-26 MED ORDER — HEPARIN (PORCINE) IN NACL 100-0.45 UNIT/ML-% IJ SOLN
1000.0000 [IU]/h | INTRAMUSCULAR | Status: DC
  Administered 2018-03-26: 1000 [IU]/h via INTRAVENOUS
  Filled 2018-03-26: qty 250

## 2018-03-26 MED ORDER — AMIODARONE LOAD VIA INFUSION
150.0000 mg | Freq: Once | INTRAVENOUS | Status: AC
  Administered 2018-03-26: 150 mg via INTRAVENOUS
  Filled 2018-03-26: qty 83.34

## 2018-03-26 MED ORDER — AMIODARONE HCL IN DEXTROSE 360-4.14 MG/200ML-% IV SOLN
30.0000 mg/h | INTRAVENOUS | Status: DC
  Administered 2018-03-27 – 2018-03-28 (×2): 30 mg/h via INTRAVENOUS
  Filled 2018-03-26 (×3): qty 200

## 2018-03-26 MED ORDER — AMIODARONE HCL IN DEXTROSE 360-4.14 MG/200ML-% IV SOLN
60.0000 mg/h | INTRAVENOUS | Status: AC
  Administered 2018-03-26 (×3): 60 mg/h via INTRAVENOUS
  Filled 2018-03-26 (×3): qty 200

## 2018-03-26 MED ORDER — SODIUM CHLORIDE 0.9% FLUSH: 3.0000 mL | Freq: Two times a day (BID) | INTRAVENOUS | Status: DC

## 2018-03-26 MED ORDER — POLYETHYLENE GLYCOL 3350 17 G PO PACK
17.0000 g | PACK | Freq: Once | ORAL | Status: AC
  Administered 2018-03-26: 17 g via ORAL
  Filled 2018-03-26: qty 1

## 2018-03-26 MED ORDER — SODIUM CHLORIDE 0.9% FLUSH: 3.0000 mL | INTRAVENOUS | Status: DC | PRN

## 2018-03-26 MED ORDER — DEXTROSE 5 % IV SOLN: 300.0000 mg | Freq: Once | INTRAVENOUS | Status: DC

## 2018-03-26 MED ORDER — ASPIRIN 81 MG PO CHEW
81.0000 mg | CHEWABLE_TABLET | ORAL | Status: AC
Start: 2018-03-27 — End: 2018-03-27
  Administered 2018-03-27: 81 mg via ORAL
  Filled 2018-03-26: qty 1

## 2018-03-26 MED ORDER — SODIUM CHLORIDE 0.9 % IV SOLN
INTRAVENOUS | Status: DC
  Administered 2018-03-27: 10 mL via INTRAVENOUS

## 2018-03-26 MED ORDER — MAGNESIUM SULFATE 2 GM/50ML IV SOLN
2.0000 g | Freq: Once | INTRAVENOUS | Status: AC
  Administered 2018-03-26: 2 g via INTRAVENOUS
  Filled 2018-03-26: qty 50

## 2018-03-26 MED ORDER — NITROGLYCERIN 0.4 MG SL SUBL
SUBLINGUAL_TABLET | SUBLINGUAL | Status: AC
  Administered 2018-03-26: 0.4 mg
  Filled 2018-03-26: qty 1

## 2018-03-26 MED ORDER — FUROSEMIDE 10 MG/ML IJ SOLN: 80.0000 mg | Freq: Two times a day (BID) | INTRAMUSCULAR | Status: DC

## 2018-03-26 MED ORDER — POLYETHYLENE GLYCOL 3350 17 G PO PACK
17.0000 g | PACK | Freq: Every day | ORAL | Status: DC | PRN
  Administered 2018-03-28 – 2018-03-31 (×3): 17 g via ORAL
  Filled 2018-03-26 (×4): qty 1

## 2018-03-26 MED ORDER — AMIODARONE LOAD VIA INFUSION
300.0000 mg | Freq: Once | INTRAVENOUS | Status: AC
  Administered 2018-03-26: 300 mg via INTRAVENOUS
  Filled 2018-03-26: qty 166.67

## 2018-03-26 NOTE — Progress Notes (Signed)
Demerol 12.5mg  given IV. Wasted 12.5mg  in sharps with Idelia SalmShirley Newcomer RN .

## 2018-03-26 NOTE — Progress Notes (Signed)
Pt converts to Afib/RVR w/rates 140-170s.  Patient asymptomatic except for states she has started having chills, shivering again.  Patient does not appear to be shivering at the moment. Dr. Gala RomneyBensimhon updated.  New orders received for Amiodarone bolus followed by continuous infusion.  One time order for IV Demerol as well for rigors as this was effective yesterday.

## 2018-03-26 NOTE — Progress Notes (Signed)
Pt c/o cp 2/10 radiating up to neck with SOB.  Placed on 4LNC, bp 162/86 gave NTG sl x1 obtained EKG AFIB with left BBB  bp stable.  Chest pain 0/10. SOB improved.  Md made aware.  Will continue to monitor [name] Rosales, [name] T

## 2018-03-26 NOTE — Evaluation (Addendum)
Physical Therapy Evaluation Patient Details Name: [name] Rosales MRN: [id number] DOB: 11-11-1940 Today's Date: 03/26/2018   History of Present Illness  Pt is a 77 y.o. 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 Heart cath scheduled 7/29. PMH includes HTN, OSA, LBBB.    Clinical Impression  Patient evaluated by Physical Therapy with no further acute PT needs identified. PTA, pt indep and lives alone; will have 24/7 support available from family. Today, pt ambulating well with supervision; continues to have BLE swelling, but this has improved significantly allowing for increased knee ROM and ability to ascend stairs. No SOB/DOE noted. All education has been completed and the patient has no further questions. Pt encouraged to continued ambulating with supervision from family and/or nursing staff. PT is signing off. Thank you for this referral.    Follow Up Recommendations No PT follow up;Supervision for mobility/OOB    Equipment Recommendations  None recommended by PT    Recommendations for Other Services       Precautions / Restrictions Precautions Precautions: Fall Restrictions Weight Bearing Restrictions: No      Mobility  Bed Mobility Overal bed mobility: Independent                Transfers Overall transfer level: Independent Equipment used: None Transfers: Sit to/from Stand Sit to Stand: Independent            Ambulation/Gait Ambulation/Gait assistance: Supervision Gait Distance (Feet): 350 Feet Assistive device: None Gait Pattern/deviations: Step-through pattern;Decreased stride length;Wide base of support Gait velocity: Decreased Gait velocity interpretation: 1.31 - 2.62 ft/sec, indicative of limited community ambulator General Gait Details: Slow, steady amb with supervision for balance; pt with BLE swelling resulting in wide BOS and 2x self-corrected lateral  sway  Stairs Stairs: (Simulated ascending steps by high marching with single rail support)          Wheelchair Mobility    Modified Rankin (Stroke Patients Only)       Balance Overall balance assessment: Needs assistance   Sitting balance-Leahy Scale: Good       Standing balance-Leahy Scale: Fair                               Pertinent Vitals/Pain Pain Assessment: No/denies pain    Home Living Family/patient expects to be discharged to:: Private residence Living Arrangements: Alone Available Help at Discharge: Family Type of Home: House Home Access: Stairs to enter Entrance Stairs-Rails: None Entrance Stairs-Number of Steps: 3 w/ no rail in front; level entry in garage, but then up 13 steps w/ rail to main level Home Layout: Two level Home Equipment: None      Prior Function Level of Independence: Independent               Hand Dominance        Extremity/Trunk Assessment   Upper Extremity Assessment Upper Extremity Assessment: Overall WFL for tasks assessed    Lower Extremity Assessment Lower Extremity Assessment: (BLE swelling in unna boots; strength/ROM [facility])       Communication   Communication: No difficulties  Cognition Arousal/Alertness: Awake/alert Behavior During Therapy: WFL for tasks assessed/performed Overall Cognitive Status: Within Functional Limits for tasks assessed  General Comments General comments (skin integrity, edema, etc.): Daughters present during session    Exercises     Assessment/Plan    PT Assessment Patent does not need any further PT services  PT Problem List         PT Treatment Interventions      PT Goals (Current goals can be found in the Care Plan section)  Acute Rehab PT Goals PT Goal Formulation: All assessment and education complete, DC therapy    Frequency     Barriers to discharge        Co-evaluation                AM-PAC PT "6 Clicks" Daily Activity  Outcome Measure Difficulty turning over in bed (including adjusting bedclothes, sheets and blankets)?: None Difficulty moving from lying on back to sitting on the side of the bed? : None Difficulty sitting down on and standing up from a chair with arms (e.g., wheelchair, bedside commode, etc,.)?: None Help needed moving to and from a bed to chair (including a wheelchair)?: None Help needed walking in hospital room?: A Little Help needed climbing 3-5 steps with a railing? : A Little 6 Click Score: 22    End of Session Equipment Utilized During Treatment: Gait belt Activity Tolerance: Patient tolerated treatment well Patient left: in bed;with call bell/phone within reach;with family/visitor present Nurse Communication: Mobility status PT Visit Diagnosis: Other abnormalities of gait and mobility (R26.89)    Time: 1610-9604 PT Time Calculation (min) (ACUTE ONLY): 27 min   Charges:   PT Evaluation $PT Eval Moderate Complexity: 1 Mod PT Treatments $Gait Training: 8-22 mins       Ina Homes, PT, DPT Acute Rehab Services  Pager: [phone]  Malachy Chamber 03/26/2018, 3:14 PM

## 2018-03-26 NOTE — Progress Notes (Signed)
Home CPAP at bedside 

## 2018-03-26 NOTE — Progress Notes (Signed)
ANTICOAGULATION CONSULT NOTE - Initial Consult  Pharmacy Consult for heparin Indication: atrial fibrillation  No Known Allergies  Patient Measurements: Height: 5\' 6"  (167.6 cm) Weight: 137 lb (62.1 kg) IBW/kg (Calculated) : 59.3 Heparin Dosing Weight: 69.4 kg  Vital Signs: Temp: 98.2 F (36.8 C) (07/28 1940) Temp Source: Oral (07/28 1940) BP: 135/52 (07/28 2126) Pulse Rate: 122 (07/28 2100)  Labs: Recent Labs    03/24/18 1457 03/24/18 2031 03/25/18 0211 03/26/18 0346  HGB 12.2  --   --   --   HCT 37.5  --   --   --   PLT 174  --   --   --   CREATININE 0.72  --  0.66 0.79  TROPONINI <0.03 <0.03 <0.03  --     Estimated Creatinine Clearance: 55.1 mL/min (by C-G formula based on SCr of 0.79 mg/dL).   Medical History: No past medical history on file.  Medications:  Medications Prior to Admission  Medication Sig Dispense Refill Last Dose  . acetaminophen (TYLENOL) 500 MG tablet Take 500 mg by mouth daily as needed.   03/23/2018  . amLODipine (NORVASC) 10 MG tablet Take 10 mg by mouth daily.   03/23/2018  . bumetanide (BUMEX) 0.5 MG tablet Take 0.5 mg by mouth 2 (two) times daily.  2 03/23/2018  . calcium carbonate (TUMS - DOSED IN MG ELEMENTAL CALCIUM) 500 MG chewable tablet Chew 2 tablets by mouth daily.   03/23/2018  . candesartan (ATACAND) 32 MG tablet Take 32 mg by mouth daily.   03/23/2018  . cloNIDine (CATAPRES) 0.1 MG tablet Take 0.3-0.4 mg by mouth See admin instructions. Take 0.4 mg by mouth twice daily  (morning and bedtime) Take 0.3 mg by mouth twice daily ( 12pm  & 6pm)   03/23/2018  . magnesium oxide (MAG-OX) 400 MG tablet Take 1 tablet by mouth daily.  0 03/23/2018  . metoprolol succinate (TOPROL-XL) 100 MG 24 hr tablet Take 100-200 mg by mouth See admin instructions. Take 200 mg every morning  Take 100 mg every evening   03/23/2018  . multivitamin-lutein (OCUVITE-LUTEIN) CAPS capsule Take 1 capsule by mouth daily.   03/23/2018  . NON FORMULARY CPAP   03/23/2018   . phenytoin (DILANTIN) 100 MG ER capsule Take 100-200 mg by mouth See admin instructions. Take 100 - 200 mg every morning  Take 100 mg midday (12 pm) Take 100 mg at bedtime   03/23/2018  . Potassium 99 MG TABS Take 99 mg by mouth daily.   03/23/2018  . spironolactone (ALDACTONE) 25 MG tablet Take 25 mg by mouth 2 (two) times daily.    03/23/2018    Assessment: 77 yo lady to start heparin therapy for afib.  She received 40 mg lovenox sq earlier tonight. Goal of Therapy:  Heparin level 0.3-0.7 units/ml Monitor platelets by anticoagulation protocol: Yes   Plan:  Heparin 1000 unit bolus and drip at 1000 units/hr Check heparin in 6-8 hours Daily HL and CBC while on heparin Monitor for bleeding complications Thanks for allowing pharmacy to be a part of this patient's care.  Talbert CageLora Ericka Marcellus, PharmD Clinical Pharmacist 03/26/2018,10:52 PM

## 2018-03-26 NOTE — Progress Notes (Signed)
Pt medicated w/Demerol 12.5mg  IVP per order; 12.5mg  wasted w/Hye Mikle BosworthSuk Lee, RN as witness.

## 2018-03-26 NOTE — Progress Notes (Signed)
Advanced Heart Failure Rounding Note   Subjective:    Was shivering yesterday but no fever. BCX drawn. Given demerol with improvement.   b-blocker cut back for low co-ox. HF meds adjusted.   Remains on IV lasix. Weight down another 8 pounds (16 pounds total). CVP 4. Co-ox  49%->72%. Na 129  Breathing better, no orthopnea or PND  ECHO EF ~35%   Objective:   Weight Range:  Vital Signs:   Temp:  [97.8 F (36.6 C)-98.9 F (37.2 C)] 97.8 F (36.6 C) (07/28 1206) Pulse Rate:  [76] 76 (07/28 0328) Resp:  [20-21] 20 (07/28 0328) BP: (147-186)/(63-92) 147/80 (07/28 1206) SpO2:  [93 %-97 %] 95 % (07/28 1206) Weight:  [62.1 kg (137 lb)] 62.1 kg (137 lb) (07/28 0500) Last BM Date: 03/24/18  Weight change: Filed Weights   03/24/18 1530 03/25/18 0637 03/26/18 0500  Weight: 69.4 kg (153 lb) 65.8 kg (145 lb) 62.1 kg (137 lb)    Intake/Output:   Intake/Output Summary (Last 24 hours) at 03/26/2018 1436 Last data filed at 03/26/2018 1207 Gross per 24 hour  Intake 360 ml  Output 4025 ml  Net -3665 ml     Physical Exam: General:  Sitting up in chair. No resp difficulty HEENT: normal Neck: supple. no JVD  Carotids 2+ bilat; no bruits. No lymphadenopathy or thryomegaly appreciated. Cor: PMI nondisplaced. Regular rate & rhythm. 2/6 TR Lungs: clear Abdomen: soft, nontender, nondistended. No hepatosplenomegaly. No bruits or masses. Good bowel sounds. Extremities: no cyanosis, clubbing, rash, tr-1+ edema +UNNA boots  RUE PICC Neuro: alert & orientedx3, cranial nerves grossly intact. moves all 4 extremities w/o difficulty. Affect pleasant   Telemetry: NSR 70-80s LBBB. Personally reviewed   Labs: Basic Metabolic Panel: Recent Labs  Lab 03/24/18 1457 03/25/18 0211 03/25/18 1847 03/26/18 0346  NA 133* 132*  --  129*  K 3.9 4.0 3.6 3.9  CL 99 95*  --  92*  CO2 26 27  --  27  GLUCOSE 121* 104*  --  153*  BUN 12 11  --  10  CREATININE 0.72 0.66  --  0.79  CALCIUM 8.3*  8.3*  --  8.1*  MG 1.9  --  1.7  --     Liver Function Tests: Recent Labs  Lab 03/24/18 1457  AST 40  ALT 21  ALKPHOS 162*  BILITOT 0.8  PROT 7.0  ALBUMIN 3.1*   No results for input(s): LIPASE, AMYLASE in the last 168 hours. No results for input(s): AMMONIA in the last 168 hours.  CBC: Recent Labs  Lab 03/24/18 1457  WBC 2.9*  NEUTROABS 1.7  HGB 12.2  HCT 37.5  MCV 101.1*  PLT 174    Cardiac Enzymes: Recent Labs  Lab 03/24/18 1457 03/24/18 2031 03/25/18 0211  TROPONINI <0.03 <0.03 <0.03    BNP: BNP (last 3 results) Recent Labs    03/24/18 1457  BNP 2,425.8*    ProBNP (last 3 results) No results for input(s): PROBNP in the last 8760 hours.    Other results:  Imaging: Dg Chest Port 1 View  Result Date: 03/24/2018 CLINICAL DATA:  Shortness of breath. EXAM: PORTABLE CHEST 1 VIEW COMPARISON:  None. FINDINGS: Cardiomegaly identified. There is no evidence of focal airspace disease, pulmonary edema, suspicious pulmonary nodule/mass, pleural effusion, or pneumothorax. No acute bony abnormalities are identified. IMPRESSION: Cardiomegaly without evidence of acute cardiopulmonary disease. Electronically Signed   By: Harmon PierJeffrey  Hu M.D.   On: 03/24/2018 20:05   [place] Ekg  Site Rite  Result Date: 03/24/2018 If Site Rite image not attached, placement could not be confirmed due to current cardiac rhythm.    Medications:     Scheduled Medications: . amLODipine  10 mg Oral Daily  . enoxaparin (LOVENOX) injection  40 mg Subcutaneous Q24H  . furosemide  80 mg Intravenous BID  . hydrALAZINE  25 mg Oral Q8H  . hydrocortisone cream   Topical BID  . phenytoin  100 mg Oral TID  . potassium chloride  20 mEq Oral BID  . potassium chloride  40 mEq Oral BID  . sacubitril-valsartan  1 tablet Oral BID  . sodium chloride flush  10-40 mL Intracatheter Q12H  . sodium chloride flush  3 mL Intravenous Q12H  . spironolactone  25 mg Oral Daily    Infusions: . sodium chloride       PRN Medications: sodium chloride, acetaminophen, hydrALAZINE, ondansetron (ZOFRAN) IV, phenytoin, sodium chloride flush, sodium chloride flush, traMADol   Assessment:   77 y/o woman admitted 7/26 with acute on chronic systolic HF  Plan/Discussion:    1. 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 Differential also includes ischemic CM, infiltrative CM (amyloid) and CTD-related PAH/HF - admitted with massive volume overload and NYHA IIIB-IV symptoms - responding well to IV lasix. Weight down 16 pounds. CVP 4. Will give one more dose IV lasix then switch to po.  - BB cut back Co-ox 50%->73%.  - continue UNNA boots - Continue Entresto 97/103.  - Continue carvedilol 3.125 bid - Continue spiro. - Continue amlodipine for now.  - Continue hydralazine 25 tid can titrate up as needed - R/L cath tomorrow  - Likely will also need cMRI  2. Severe HTN - Improving. SBP 140-150s - Continue Entresto 97/103 bid - Continue spiro and amlodipine - Continue hydralazine 25 tid. Can titrate as needed - PRN IV hydralazine for SBP > 160  3. Facial rash - reviewed with Dermatology. 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 Need titer - hydrocortisone 2.5% to face and neck  4. LBBB  - consider CRT as needed  5. OSA - continue CPAP  6. Migraines - continue dilantin  7. Hyponatremia - Free water restrict  Length of Stay: 2   Arvilla Meres MD 03/26/2018, 2:36 PM  Advanced Heart Failure Team Pager [phone] (M-F; 7a - 4p)  Please contact [facility] for night-coverage after hours (4p -7a ) and weekends on [email or web address]

## 2018-03-27 ENCOUNTER — Encounter (HOSPITAL_COMMUNITY)
Admission: AD | Disposition: A | Discharge status: Home Health | Payer: Self-pay | Source: Ambulatory Visit | Attending: Internal Medicine

## 2018-03-27 ENCOUNTER — Encounter (HOSPITAL_COMMUNITY): Payer: Self-pay | Admitting: Internal Medicine

## 2018-03-27 DIAGNOSIS — I480 Paroxysmal atrial fibrillation: Secondary | ICD-10-CM

## 2018-03-27 DIAGNOSIS — I2510 Atherosclerotic heart disease of native coronary artery without angina pectoris: Secondary | ICD-10-CM

## 2018-03-27 HISTORY — PX: CORONARY STENT INTERVENTION: CATH118234

## 2018-03-27 HISTORY — PX: RIGHT/LEFT HEART CATH AND CORONARY ANGIOGRAPHY: CATH118266

## 2018-03-27 LAB — POCT ACTIVATED CLOTTING TIME
ACTIVATED CLOTTING TIME: 263 s
ACTIVATED CLOTTING TIME: 224 s
Activated Clotting Time: 312 seconds
Activated Clotting Time: 252 seconds
Activated Clotting Time: 186 seconds

## 2018-03-27 LAB — POCT I-STAT 3, VENOUS BLOOD GAS (G3P V)
ACID-BASE DEFICIT: 1 mmol/L (ref 0.0–2.0)
Acid-Base Excess: 1 mmol/L (ref 0.0–2.0)
BICARBONATE: 25 mmol/L (ref 20.0–28.0)
Bicarbonate: 24.2 mmol/L (ref 20.0–28.0)
Bicarbonate: 24.9 mmol/L (ref 20.0–28.0)
Bicarbonate: 25.9 mmol/L (ref 20.0–28.0)
O2 Saturation: 75 %
O2 Saturation: 76 %
O2 Saturation: 77 %
O2 Saturation: 79 %
PCO2 VEN: 40.1 mmHg — AB (ref 44.0–60.0)
PH VEN: 7.413 (ref 7.250–7.430)
TCO2: 25 mmol/L (ref 22–32)
TCO2: 26 mmol/L (ref 22–32)
TCO2: 26 mmol/L (ref 22–32)
TCO2: 27 mmol/L (ref 22–32)
pCO2, Ven: 39.1 mmHg — ABNORMAL LOW (ref 44.0–60.0)
pCO2, Ven: 40.6 mmHg — ABNORMAL LOW (ref 44.0–60.0)
pCO2, Ven: 40.6 mmHg — ABNORMAL LOW (ref 44.0–60.0)
pH, Ven: 7.384 (ref 7.250–7.430)
pH, Ven: 7.401 (ref 7.250–7.430)
pH, Ven: 7.413 (ref 7.250–7.430)
pO2, Ven: 41 mmHg (ref 32.0–45.0)
pO2, Ven: 41 mmHg (ref 32.0–45.0)
pO2, Ven: 41 mmHg (ref 32.0–45.0)
pO2, Ven: 43 mmHg (ref 32.0–45.0)

## 2018-03-27 LAB — CBC
HEMATOCRIT: 40.4 % (ref 36.0–46.0)
HEMOGLOBIN: 13.7 g/dL (ref 12.0–15.0)
MCH: 33.6 pg (ref 26.0–34.0)
MCHC: 33.9 g/dL (ref 30.0–36.0)
MCV: 99 fL (ref 78.0–100.0)
Platelets: 199 10*3/uL (ref 150–400)
RBC: 4.08 MIL/uL (ref 3.87–5.11)
RDW: 15.1 % (ref 11.5–15.5)
WBC: 5 10*3/uL (ref 4.0–10.5)

## 2018-03-27 LAB — POCT I-STAT 3, ART BLOOD GAS (G3+)
Bicarbonate: 24.8 mmol/L (ref 20.0–28.0)
O2 SAT: 98 %
TCO2: 26 mmol/L (ref 22–32)
pCO2 arterial: 38.9 mmHg (ref 32.0–48.0)
pH, Arterial: 7.412 (ref 7.350–7.450)
pO2, Arterial: 97 mmHg (ref 83.0–108.0)

## 2018-03-27 LAB — BASIC METABOLIC PANEL
ANION GAP: 13 (ref 5–15)
BUN: 7 mg/dL — ABNORMAL LOW (ref 8–23)
CHLORIDE: 92 mmol/L — AB (ref 98–111)
CO2: 22 mmol/L (ref 22–32)
Calcium: 8.4 mg/dL — ABNORMAL LOW (ref 8.9–10.3)
Creatinine, Ser: 0.78 mg/dL (ref 0.44–1.00)
GFR calc non Af Amer: >60 mL/min (ref >60)
Glucose, Bld: 119 mg/dL — ABNORMAL HIGH (ref 70–99)
Potassium: 4.9 mmol/L (ref 3.5–5.1)
SODIUM: 127 mmol/L — AB (ref 135–145)

## 2018-03-27 LAB — COOXEMETRY PANEL
CARBOXYHEMOGLOBIN: 1.5 % (ref 0.5–1.5)
Methemoglobin: 0.7 % (ref 0.0–1.5)
O2 SAT: 66.9 %
TOTAL HEMOGLOBIN: 14.2 g/dL (ref 12.0–16.0)

## 2018-03-27 LAB — HEPARIN LEVEL (UNFRACTIONATED): Heparin Unfractionated: 0.94 IU/mL — ABNORMAL HIGH (ref 0.30–0.70)

## 2018-03-27 SURGERY — RIGHT/LEFT HEART CATH AND CORONARY ANGIOGRAPHY
Anesthesia: LOCAL

## 2018-03-27 MED ORDER — DIAZEPAM 5 MG PO TABS
2.5000 mg | ORAL_TABLET | Freq: Once | ORAL | Status: AC
  Administered 2018-03-28: 2.5 mg via ORAL
  Filled 2018-03-27: qty 1

## 2018-03-27 MED ORDER — ONDANSETRON HCL 4 MG/2ML IJ SOLN: 4.0000 mg | Freq: Four times a day (QID) | INTRAMUSCULAR | Status: DC | PRN

## 2018-03-27 MED ORDER — SODIUM CHLORIDE 0.9% FLUSH
3.0000 mL | Freq: Two times a day (BID) | INTRAVENOUS | Status: DC
  Administered 2018-03-28 (×2): 3 mL via INTRAVENOUS
  Administered 2018-03-29: 22:00:00 via INTRAVENOUS
  Administered 2018-03-30 – 2018-03-31 (×3): 3 mL via INTRAVENOUS

## 2018-03-27 MED ORDER — HYDRALAZINE HCL 20 MG/ML IJ SOLN: 5.0000 mg | INTRAMUSCULAR | Status: AC | PRN

## 2018-03-27 MED ORDER — MIDAZOLAM HCL 2 MG/2ML IJ SOLN
INTRAMUSCULAR | Status: DC | PRN
  Administered 2018-03-27 (×2): 0.5 mg via INTRAVENOUS

## 2018-03-27 MED ORDER — SODIUM CHLORIDE 0.9 % IV SOLN: INTRAVENOUS | Status: AC

## 2018-03-27 MED ORDER — FENTANYL CITRATE (PF) 100 MCG/2ML IJ SOLN
INTRAMUSCULAR | Status: DC | PRN
  Administered 2018-03-27: 25 ug via INTRAVENOUS

## 2018-03-27 MED ORDER — FENTANYL CITRATE (PF) 100 MCG/2ML IJ SOLN
INTRAMUSCULAR | Status: DC | PRN
  Administered 2018-03-27 (×2): 25 ug via INTRAVENOUS

## 2018-03-27 MED ORDER — HEPARIN (PORCINE) IN NACL 1000-0.9 UT/500ML-% IV SOLN
INTRAVENOUS | Status: AC
  Filled 2018-03-27: qty 500

## 2018-03-27 MED ORDER — ASPIRIN 81 MG PO CHEW
81.0000 mg | CHEWABLE_TABLET | Freq: Every day | ORAL | Status: DC
  Administered 2018-03-28 – 2018-04-01 (×5): 81 mg via ORAL
  Filled 2018-03-27 (×5): qty 1

## 2018-03-27 MED ORDER — IOHEXOL 350 MG/ML SOLN
INTRAVENOUS | Status: DC | PRN
  Administered 2018-03-27: 60 mL via INTRA_ARTERIAL

## 2018-03-27 MED ORDER — SODIUM CHLORIDE 0.9 % IV SOLN: 250.0000 mL | INTRAVENOUS | Status: DC | PRN

## 2018-03-27 MED ORDER — HEPARIN (PORCINE) IN NACL 1000-0.9 UT/500ML-% IV SOLN
INTRAVENOUS | Status: DC | PRN
  Administered 2018-03-27 (×2): 500 mL

## 2018-03-27 MED ORDER — MIDAZOLAM HCL 2 MG/2ML IJ SOLN
INTRAMUSCULAR | Status: AC
  Filled 2018-03-27: qty 2

## 2018-03-27 MED ORDER — SODIUM CHLORIDE 0.9% FLUSH: 3.0000 mL | INTRAVENOUS | Status: DC | PRN

## 2018-03-27 MED ORDER — HEPARIN (PORCINE) IN NACL 1000-0.9 UT/500ML-% IV SOLN
INTRAVENOUS | Status: AC
  Filled 2018-03-27: qty 1000

## 2018-03-27 MED ORDER — LIDOCAINE HCL (PF) 1 % IJ SOLN
INTRAMUSCULAR | Status: AC
  Filled 2018-03-27: qty 30

## 2018-03-27 MED ORDER — HEPARIN (PORCINE) IN NACL 1000-0.9 UT/500ML-% IV SOLN
INTRAVENOUS | Status: DC | PRN
  Administered 2018-03-27: 500 mL

## 2018-03-27 MED ORDER — HEPARIN SODIUM (PORCINE) 1000 UNIT/ML IJ SOLN
INTRAMUSCULAR | Status: AC
  Filled 2018-03-27: qty 1

## 2018-03-27 MED ORDER — MIDAZOLAM HCL 2 MG/2ML IJ SOLN
INTRAMUSCULAR | Status: DC | PRN
  Administered 2018-03-27: 0.5 mg via INTRAVENOUS
  Administered 2018-03-27: 1 mg via INTRAVENOUS
  Administered 2018-03-27: 0.5 mg via INTRAVENOUS

## 2018-03-27 MED ORDER — LABETALOL HCL 5 MG/ML IV SOLN: 10.0000 mg | INTRAVENOUS | Status: AC | PRN

## 2018-03-27 MED ORDER — FENTANYL CITRATE (PF) 100 MCG/2ML IJ SOLN
INTRAMUSCULAR | Status: AC
  Filled 2018-03-27: qty 2

## 2018-03-27 MED ORDER — LIDOCAINE HCL (PF) 1 % IJ SOLN
INTRAMUSCULAR | Status: DC | PRN
  Administered 2018-03-27 (×2): 2 mL

## 2018-03-27 MED ORDER — ATORVASTATIN CALCIUM 80 MG PO TABS
80.0000 mg | ORAL_TABLET | Freq: Every day | ORAL | Status: DC
  Administered 2018-03-27 – 2018-03-31 (×5): 80 mg via ORAL
  Filled 2018-03-27 (×6): qty 1

## 2018-03-27 MED ORDER — NITROGLYCERIN 1 MG/10 ML FOR IR/CATH LAB
INTRA_ARTERIAL | Status: DC | PRN
  Administered 2018-03-27: 200 ug via INTRACORONARY

## 2018-03-27 MED ORDER — CLOPIDOGREL BISULFATE 300 MG PO TABS
ORAL_TABLET | ORAL | Status: DC | PRN
  Administered 2018-03-27: 600 mg via ORAL

## 2018-03-27 MED ORDER — NITROGLYCERIN 1 MG/10 ML FOR IR/CATH LAB
INTRA_ARTERIAL | Status: AC
  Filled 2018-03-27: qty 10

## 2018-03-27 MED ORDER — VERAPAMIL HCL 2.5 MG/ML IV SOLN
INTRAVENOUS | Status: AC
  Filled 2018-03-27: qty 2

## 2018-03-27 MED ORDER — HEPARIN SODIUM (PORCINE) 1000 UNIT/ML IJ SOLN
INTRAMUSCULAR | Status: DC | PRN
  Administered 2018-03-27 (×3): 2000 [IU] via INTRAVENOUS
  Administered 2018-03-27: 5000 [IU] via INTRAVENOUS

## 2018-03-27 MED ORDER — APIXABAN 5 MG PO TABS
5.0000 mg | ORAL_TABLET | Freq: Two times a day (BID) | ORAL | Status: DC
  Administered 2018-03-27 – 2018-04-01 (×10): 5 mg via ORAL
  Filled 2018-03-27 (×10): qty 1

## 2018-03-27 MED ORDER — HYDRALAZINE HCL 20 MG/ML IJ SOLN
INTRAMUSCULAR | Status: DC | PRN
  Administered 2018-03-27: 10 mg via INTRAVENOUS

## 2018-03-27 MED ORDER — ACETAMINOPHEN 325 MG PO TABS: 650.0000 mg | ORAL_TABLET | ORAL | Status: DC | PRN

## 2018-03-27 MED ORDER — MEPERIDINE HCL 25 MG/ML IJ SOLN
12.5000 mg | Freq: Once | INTRAMUSCULAR | Status: AC
  Administered 2018-03-27: 12.5 mg via INTRAVENOUS
  Filled 2018-03-27: qty 1

## 2018-03-27 MED ORDER — HEPARIN SODIUM (PORCINE) 1000 UNIT/ML IJ SOLN
INTRAMUSCULAR | Status: DC | PRN
  Administered 2018-03-27: 3000 [IU] via INTRAVENOUS

## 2018-03-27 MED ORDER — VERAPAMIL HCL 2.5 MG/ML IV SOLN
INTRAVENOUS | Status: DC | PRN
  Administered 2018-03-27: 10 mL via INTRA_ARTERIAL

## 2018-03-27 MED ORDER — HYDRALAZINE HCL 20 MG/ML IJ SOLN
INTRAMUSCULAR | Status: AC
  Filled 2018-03-27: qty 1

## 2018-03-27 MED ORDER — SODIUM CHLORIDE 0.9 % IV SOLN
INTRAVENOUS | Status: AC | PRN
  Administered 2018-03-27: 10 mL/h via INTRAVENOUS

## 2018-03-27 MED ORDER — CLOPIDOGREL BISULFATE 75 MG PO TABS
75.0000 mg | ORAL_TABLET | Freq: Every day | ORAL | Status: DC
  Administered 2018-03-28 – 2018-04-01 (×5): 75 mg via ORAL
  Filled 2018-03-27 (×5): qty 1

## 2018-03-27 SURGICAL SUPPLY — 24 items
BALLN EMERGE MR 2.25X12 (BALLOONS) ×2
BALLN SAPPHIRE 2.0X12 (BALLOONS) ×2
BALLN SAPPHIRE ~~LOC~~ 2.5X12 (BALLOONS) ×2 IMPLANT
BALLN ~~LOC~~ EMERGE MR 2.25X12 (BALLOONS) ×2
BALLOON EMERGE MR 2.25X12 (BALLOONS) ×1 IMPLANT
BALLOON SAPPHIRE 2.0X12 (BALLOONS) ×1 IMPLANT
BALLOON ~~LOC~~ EMERGE MR 2.25X12 (BALLOONS) ×1 IMPLANT
CATH 5FR JL3.5 JR4 ANG PIG MP (CATHETERS) ×2 IMPLANT
CATH BALLN WEDGE 5F 110CM (CATHETERS) ×2 IMPLANT
CATH VISTA GUIDE 6FR JR4 (CATHETERS) ×2 IMPLANT
DEVICE RAD COMP TR BAND LRG (VASCULAR PRODUCTS) ×2 IMPLANT
GLIDESHEATH SLEND SS 6F .021 (SHEATH) ×2 IMPLANT
GUIDEWIRE .025 260CM (WIRE) ×2 IMPLANT
GUIDEWIRE INQWIRE 1.5J.035X260 (WIRE) ×1 IMPLANT
INQWIRE 1.5J .035X260CM (WIRE) ×2
KIT ENCORE 26 ADVANTAGE (KITS) ×2 IMPLANT
KIT HEART LEFT (KITS) ×2 IMPLANT
PACK CARDIAC CATHETERIZATION (CUSTOM PROCEDURE TRAY) ×2 IMPLANT
SHEATH GLIDE SLENDER 4/5FR (SHEATH) ×2 IMPLANT
STENT SYNERGY DES 3X20 (Permanent Stent) ×2 IMPLANT
STOPCOCK MORSE 400PSI 3WAY (MISCELLANEOUS) ×2 IMPLANT
TRANSDUCER W/STOPCOCK (MISCELLANEOUS) ×2 IMPLANT
TUBING CIL FLEX 10 FLL-RA (TUBING) ×2 IMPLANT
WIRE ASAHI PROWATER 180CM (WIRE) ×2 IMPLANT

## 2018-03-27 NOTE — Progress Notes (Signed)
ANTICOAGULATION CONSULT NOTE - Initial Consult  Pharmacy Consult for apixaban Indication: atrial fibrillation  No Known Allergies  Patient Measurements: Height: 5\' 6"  (167.6 cm) Weight: 133 lb 4.8 oz (60.5 kg) IBW/kg (Calculated) : 59.3  Vital Signs: Temp: 98.3 F (36.8 C) (07/29 1141) Temp Source: Oral (07/29 1141) BP: 154/65 (07/29 1141) Pulse Rate: 92 (07/29 1141)  Labs: Recent Labs    03/24/18 1457 03/24/18 2031 03/25/18 0211 03/26/18 0346 03/27/18 0219 03/27/18 0700 03/27/18 0702  HGB 12.2  --   --   --   --   --  13.7  HCT 37.5  --   --   --   --   --  40.4  PLT 174  --   --   --   --   --  199  HEPARINUNFRC  --   --   --   --   --  0.94*  --   CREATININE 0.72  --  0.66 0.79 0.78  --   --   TROPONINI <0.03 <0.03 <0.03  --   --   --   --     Estimated Creatinine Clearance: 55.1 mL/min (by C-G formula based on SCr of 0.78 mg/dL).   Medical History: No past medical history on file.  Medications:  Scheduled:  . amLODipine  10 mg Oral Daily  . apixaban  5 mg Oral BID  . [START ON 03/28/2018] aspirin  81 mg Oral Daily  . atorvastatin  80 mg Oral q1800  . [START ON 03/28/2018] clopidogrel  75 mg Oral Q breakfast  . hydrALAZINE  25 mg Oral Q8H  . hydrocortisone cream   Topical BID  . phenytoin  100 mg Oral TID  . potassium chloride  20 mEq Oral BID  . potassium chloride  40 mEq Oral BID  . sacubitril-valsartan  1 tablet Oral BID  . sodium chloride flush  10-40 mL Intracatheter Q12H  . sodium chloride flush  3 mL Intravenous Q12H  . spironolactone  25 mg Oral Daily    Assessment: [age] yof who underwent cath with successful mid RCA PCI. Started on apixaban for atrial fibrillation. Plan to continue for 1 month then drop aspirin.   Will start full dose given age<80 and Scr<1.5, despite weight being close to cut-off of 60 kg. Plan to start 12 hours after sheath removal (documented at 1245). Hgb 13.7, plt 199. No s/sx of bleeding.   Goal of Therapy:  Monitor  platelets by anticoagulation protocol: Yes   Plan:  Start apixaban 5 mg twice daily on 7/29 at 2330 Monitor renal fx, CBC, and for s/sx of bleeding  Girard CooterKimberly Perkins, PharmD Clinical Pharmacist  Pager: [phone] Phone: [phone] 03/27/2018,1:28 PM

## 2018-03-27 NOTE — Care Management Note (Addendum)
Case Management Note  Patient Details  Name: [name] BolognaDeanne Barfoot MRN: [id number] Date of Birth: Mar 21, 1941  Subjective/Objective:   Pt admitted with HF                 Action/Plan:  PTA from home independent.  Pt already has CPAP at home - simply needs home oxygen.  CM requested HF team to correct home oxygen order and [facility]    Expected Discharge Date:                  Expected Discharge Plan:  Home w Home Health Services  In-House Referral:     Discharge planning Services  CM Consult  Post Acute Care Choice:    Choice offered to:     DME Arranged:    DME Agency:     HH Arranged:    HH Agency:     Status of Service:     If discussed at [employer] Meetings, dates discussed:    Additional Comments: 03/27/18 Pt informed CM that she already [facility] night [facility] liter bleed in to CPAP at night.  IF pt will now need 1.5 liter bleed in an actual Home Oxygen DME order will be required [facility] confirmed that a change in liter flow does not require a new pulm ambulatory note. Cherylann ParrClaxton, Celester Lech S, RN 03/27/2018, 3:01 PM

## 2018-03-27 NOTE — Progress Notes (Signed)
Pt w/onset of new CP at approx 1845 while consuming a [employer] black bean burger.  Pain identified in left upper chest wall.  Patient also states she is having periods of belching.  Tele indicates patient baseline rhythm, NSR w/1st degree heart block, w/rate in the 90s. Five minutes after first NTG SL, patient states pain is reduced but not gone.  Given a second NTG SL.  This resulted in no continuation of pain and BP 119/48.  Dr. Iver NestleBhagat notified and no new orders noted.  Will continue to monitor.

## 2018-03-27 NOTE — Progress Notes (Signed)
  Advanced Heart Failure Rounding Note   Subjective:    Developed AF last night with CP. Started on amio and IV heparin. Back in NSR now. Feels ok.  Weight down another 4 pounds. (20 pounds total)  Breathing better. No orthopnea or PND.   ECHO EF ~35%   Objective:   Weight Range:  Vital Signs:   Temp:  [97.8 F (36.6 C)-98.8 F (37.1 C)] 98.6 F (37 C) (07/29 0639) Pulse Rate:  [80-129] 84 (07/29 0639) Resp:  [21-23] 22 (07/29 0323) BP: (133-186)/(52-104) 152/62 (07/29 0639) SpO2:  [88 %-98 %] 94 % (07/29 0639) Weight:  [60.5 kg (133 lb 4.8 oz)] 60.5 kg (133 lb 4.8 oz) (07/29 0332) Last BM Date: 03/24/18  Weight change: Filed Weights   03/25/18 0637 03/26/18 0500 03/27/18 0332  Weight: 65.8 kg (145 lb) 62.1 kg (137 lb) 60.5 kg (133 lb 4.8 oz)    Intake/Output:   Intake/Output Summary (Last 24 hours) at 03/27/2018 0739 Last data filed at 03/27/2018 0600 Gross per 24 hour  Intake 897.46 ml  Output 3550 ml  Net -2652.54 ml     Physical Exam: General:  Well appearing. No resp difficulty HEENT: normal Neck: supple. no JVD. Carotids 2+ bilat; no bruits. No lymphadenopathy or thryomegaly appreciated. Cor: PMI nondisplaced. Regular rate & rhythm. No rubs, gallops or murmurs. Lungs: clear Abdomen: soft, nontender, nondistended. No hepatosplenomegaly. No bruits or masses. Good bowel sounds. Extremities: no cyanosis, clubbing, rash, trace edema  UNNA boots  Neuro: alert & orientedx3, cranial nerves grossly intact. moves all 4 extremities w/o difficulty. Affect pleasant    Telemetry: NSR 80-90s LBBB. Personally reviewed   Labs: Basic Metabolic Panel: Recent Labs  Lab 03/24/18 1457 03/25/18 0211 03/25/18 1847 03/26/18 0346 03/27/18 0219  NA 133* 132*  --  129* 127*  K 3.9 4.0 3.6 3.9 4.9  CL 99 95*  --  92* 92*  CO2 26 27  --  27 22  GLUCOSE 121* 104*  --  153* 119*  BUN 12 11  --  10 7*  CREATININE 0.72 0.66  --  0.79 0.78  CALCIUM 8.3* 8.3*  --  8.1* 8.4*   MG 1.9  --  1.7  --   --     Liver Function Tests: Recent Labs  Lab 03/24/18 1457  AST 40  ALT 21  ALKPHOS 162*  BILITOT 0.8  PROT 7.0  ALBUMIN 3.1*   No results for input(s): LIPASE, AMYLASE in the last 168 hours. No results for input(s): AMMONIA in the last 168 hours.  CBC: Recent Labs  Lab 03/24/18 1457 03/27/18 0702  WBC 2.9* 5.0  NEUTROABS 1.7  --   HGB 12.2 13.7  HCT 37.5 40.4  MCV 101.1* 99.0  PLT 174 199    Cardiac Enzymes: Recent Labs  Lab 03/24/18 1457 03/24/18 2031 03/25/18 0211  TROPONINI <0.03 <0.03 <0.03    BNP: BNP (last 3 results) Recent Labs    03/24/18 1457  BNP 2,425.8*    ProBNP (last 3 results) No results for input(s): PROBNP in the last 8760 hours.    Other results:  Imaging: No results found.   Medications:     Scheduled Medications: . [MAR Hold] amLODipine  10 mg Oral Daily  . furosemide  80 mg Intravenous BID  . [MAR Hold] hydrALAZINE  25 mg Oral Q8H  . [MAR Hold] hydrocortisone cream   Topical BID  . [MAR Hold] phenytoin  100 mg Oral TID  . [MAR   Hold] potassium chloride  20 mEq Oral BID  . [MAR Hold] potassium chloride  40 mEq Oral BID  . [MAR Hold] sacubitril-valsartan  1 tablet Oral BID  . [MAR Hold] sodium chloride flush  10-40 mL Intracatheter Q12H  . [MAR Hold] sodium chloride flush  3 mL Intravenous Q12H  . sodium chloride flush  3 mL Intravenous Q12H  . [MAR Hold] spironolactone  25 mg Oral Daily    Infusions: . [MAR Hold] sodium chloride    . sodium chloride    . sodium chloride 10 mL/hr at 03/27/18 0600  . amiodarone 30 mg/hr (03/27/18 0657)  . heparin Stopped (03/27/18 0649)    PRN Medications: [MAR Hold] sodium chloride, sodium chloride, [MAR Hold] acetaminophen, [MAR Hold] hydrALAZINE, [MAR Hold] nitroGLYCERIN, [MAR Hold] ondansetron (ZOFRAN) IV, [MAR Hold] phenytoin, [MAR Hold] polyethylene glycol, [MAR Hold] sodium chloride flush, [MAR Hold] sodium chloride flush, sodium chloride flush, [MAR  Hold] traMADol   Assessment:   77 y/o woman admitted 7/26 with acute on chronic systolic HF  Plan/Discussion:    1. 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 Differential also includes ischemic CM, infiltrative CM (amyloid) and CTD-related PAH/HF - admitted with massive volume overload and NYHA IIIB-IV symptoms - responding well to IV lasix. Weight down 20 pounds. CVP 3-4. Stop IV lasix - BB cut back Co-ox 67% - continue UNNA boots - Continue Entresto 97/103.  - Continue carvedilol 3.125 bid - Continue spiro. - Continue amlodipine for now.  - Continue hydralazine 25 tid can titrate up as needed - R/L cath this am  - Likely will also need cMRItoday or tomorrow   2. Severe HTN - Improving. SBP 140-150s - Continue Entresto 97/103 bid - Continue spiro and amlodipine - Continue hydralazine 25 tid. Can titrate as needed - PRN IV hydralazine for SBP > 160  3. PAF, new onset - back in NSR this am on amio gtt and heparin gtt - Will need NOAC post cath  4. Facial rash - reviewed with Dermatology. 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 Need titer - hydrocortisone 2.5% to face and neck  5. LBBB  - consider CRT as needed  6. OSA - continue CPAP  6. Migraines - continue dilantin  7. Hyponatremia - NAa 127 Free water restrict  Length of Stay: 3   Kelyn Koskela MD 03/27/2018, 7:39 AM  Advanced Heart Failure Team Pager [phone] (M-F; 7a - 4p)  Please contact [facility] for night-coverage after hours (4p -7a ) and weekends on [email or web address] 

## 2018-03-27 NOTE — H&P (View-Only) (Signed)
Advanced Heart Failure Rounding Note   Subjective:    Developed AF last night with CP. Started on amio and IV heparin. Back in NSR now. Feels ok.  Weight down another 4 pounds. (20 pounds total)  Breathing better. No orthopnea or PND.   ECHO EF ~35%   Objective:   Weight Range:  Vital Signs:   Temp:  [97.8 F (36.6 C)-98.8 F (37.1 C)] 98.6 F (37 C) (07/29 0639) Pulse Rate:  [80-129] 84 (07/29 0639) Resp:  [21-23] 22 (07/29 0323) BP: (133-186)/(52-104) 152/62 (07/29 0639) SpO2:  [88 %-98 %] 94 % (07/29 0639) Weight:  [60.5 kg (133 lb 4.8 oz)] 60.5 kg (133 lb 4.8 oz) (07/29 0332) Last BM Date: 03/24/18  Weight change: Filed Weights   03/25/18 0637 03/26/18 0500 03/27/18 0332  Weight: 65.8 kg (145 lb) 62.1 kg (137 lb) 60.5 kg (133 lb 4.8 oz)    Intake/Output:   Intake/Output Summary (Last 24 hours) at 03/27/2018 0739 Last data filed at 03/27/2018 0600 Gross per 24 hour  Intake 897.46 ml  Output 3550 ml  Net -2652.54 ml     Physical Exam: General:  Well appearing. No resp difficulty HEENT: normal Neck: supple. no JVD. Carotids 2+ bilat; no bruits. No lymphadenopathy or thryomegaly appreciated. Cor: PMI nondisplaced. Regular rate & rhythm. No rubs, gallops or murmurs. Lungs: clear Abdomen: soft, nontender, nondistended. No hepatosplenomegaly. No bruits or masses. Good bowel sounds. Extremities: no cyanosis, clubbing, rash, trace edema  UNNA boots  Neuro: alert & orientedx3, cranial nerves grossly intact. moves all 4 extremities w/o difficulty. Affect pleasant    Telemetry: NSR 80-90s LBBB. Personally reviewed   Labs: Basic Metabolic Panel: Recent Labs  Lab 03/24/18 1457 03/25/18 0211 03/25/18 1847 03/26/18 0346 03/27/18 0219  NA 133* 132*  --  129* 127*  K 3.9 4.0 3.6 3.9 4.9  CL 99 95*  --  92* 92*  CO2 26 27  --  27 22  GLUCOSE 121* 104*  --  153* 119*  BUN 12 11  --  10 7*  CREATININE 0.72 0.66  --  0.79 0.78  CALCIUM 8.3* 8.3*  --  8.1* 8.4*   MG 1.9  --  1.7  --   --     Liver Function Tests: Recent Labs  Lab 03/24/18 1457  AST 40  ALT 21  ALKPHOS 162*  BILITOT 0.8  PROT 7.0  ALBUMIN 3.1*   No results for input(s): LIPASE, AMYLASE in the last 168 hours. No results for input(s): AMMONIA in the last 168 hours.  CBC: Recent Labs  Lab 03/24/18 1457 03/27/18 0702  WBC 2.9* 5.0  NEUTROABS 1.7  --   HGB 12.2 13.7  HCT 37.5 40.4  MCV 101.1* 99.0  PLT 174 199    Cardiac Enzymes: Recent Labs  Lab 03/24/18 1457 03/24/18 2031 03/25/18 0211  TROPONINI <0.03 <0.03 <0.03    BNP: BNP (last 3 results) Recent Labs    03/24/18 1457  BNP 2,425.8*    ProBNP (last 3 results) No results for input(s): PROBNP in the last 8760 hours.    Other results:  Imaging: No results found.   Medications:     Scheduled Medications: . [MAR Hold] amLODipine  10 mg Oral Daily  . furosemide  80 mg Intravenous BID  . [MAR Hold] hydrALAZINE  25 mg Oral Q8H  . [MAR Hold] hydrocortisone cream   Topical BID  . [MAR Hold] phenytoin  100 mg Oral TID  . [facility]  Hold] potassium chloride  20 mEq Oral BID  . [MAR Hold] potassium chloride  40 mEq Oral BID  . [MAR Hold] sacubitril-valsartan  1 tablet Oral BID  . [MAR Hold] sodium chloride flush  10-40 mL Intracatheter Q12H  . [MAR Hold] sodium chloride flush  3 mL Intravenous Q12H  . sodium chloride flush  3 mL Intravenous Q12H  . [MAR Hold] spironolactone  25 mg Oral Daily    Infusions: . [MAR Hold] sodium chloride    . sodium chloride    . sodium chloride 10 mL/hr at 03/27/18 0600  . amiodarone 30 mg/hr (03/27/18 0657)  . heparin Stopped (03/27/18 0649)    PRN Medications: [MAR Hold] sodium chloride, sodium chloride, [MAR Hold] acetaminophen, [MAR Hold] hydrALAZINE, [MAR Hold] nitroGLYCERIN, [MAR Hold] ondansetron (ZOFRAN) IV, [MAR Hold] phenytoin, [MAR Hold] polyethylene glycol, [MAR Hold] sodium chloride flush, [MAR Hold] sodium chloride flush, sodium chloride flush, [MAR  Hold] traMADol   Assessment:   77 y/o woman admitted 7/26 with acute on chronic systolic HF  Plan/Discussion:    1. 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 Differential also includes ischemic CM, infiltrative CM (amyloid) and CTD-related PAH/HF - admitted with massive volume overload and NYHA IIIB-IV symptoms - responding well to IV lasix. Weight down 20 pounds. CVP 3-4. Stop IV lasix - BB cut back Co-ox 67% - continue UNNA boots - Continue Entresto 97/103.  - Continue carvedilol 3.125 bid - Continue spiro. - Continue amlodipine for now.  - Continue hydralazine 25 tid can titrate up as needed - R/L cath this am  - Likely will also need cMRItoday or tomorrow   2. Severe HTN - Improving. SBP 140-150s - Continue Entresto 97/103 bid - Continue spiro and amlodipine - Continue hydralazine 25 tid. Can titrate as needed - PRN IV hydralazine for SBP > 160  3. PAF, new onset - back in NSR this am on amio gtt and heparin gtt - Will need NOAC post cath  4. Facial rash - reviewed with Dermatology. 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 Need titer - hydrocortisone 2.5% to face and neck  5. LBBB  - consider CRT as needed  6. OSA - continue CPAP  6. Migraines - continue dilantin  7. Hyponatremia - NAa 127 Free water restrict  Length of Stay: 3   Arvilla Meresaniel Bensimhon MD 03/27/2018, 7:39 AM  Advanced Heart Failure Team Pager [phone] (M-F; 7a - 4p)  Please contact [facility] for night-coverage after hours (4p -7a ) and weekends on [email or web address]

## 2018-03-27 NOTE — Interval H&P Note (Signed)
History and Physical Interval Note:  03/27/2018 7:43 AM  [name] Rosales  has presented today for surgery, with the diagnosis of HF  The various methods of treatment have been discussed with the patient and family. After consideration of risks, benefits and other options for treatment, the patient has consented to  Procedure(s): RIGHT/LEFT HEART CATH AND CORONARY ANGIOGRAPHY (N/A) and possible coronary angioplasty as a surgical intervention .  The patient's history has been reviewed, patient examined, no change in status, stable for surgery.  I have reviewed the patient's chart and labs.  Questions were answered to the patient's satisfaction.     Daniel Bensimhon

## 2018-03-27 NOTE — Progress Notes (Signed)
Patient w/rigors once again post-cath.  Unable to get comfortable d/t rigors.  Dr. Gala RomneyBensimhon paged for another one time dose of Demerol.  Await response.  TR band remains inflated w/11cc air; small amount of hematoma distal to TR band that has resolved w/pressure.  Left brachial sheath also remains intact w/NS @ KVO - await cath lab to remove.

## 2018-03-27 NOTE — Progress Notes (Signed)
Pt medicated w/Demerol 12.5mg  IVP per order for rigors.  BP 154/62; HR 90, NSR; R22; sats 94% on RA.  Continue to monitor frequently s/p heart cath.  Circ checks WNL.

## 2018-03-27 NOTE — Progress Notes (Signed)
3883f sheath removed from left brachial vein.  Manual pressure applied to site for 10 min.  choban dressing applied.  ACT was 185 prior to sheath removal.  Bruising noted medial to site and marked also prior to sheath removal.  No hematoma.  Post sheath removal instructions given.  Pt acknowledges and understands instructions.

## 2018-03-28 ENCOUNTER — Inpatient Hospital Stay (HOSPITAL_COMMUNITY): Payer: Medicare PPO

## 2018-03-28 ENCOUNTER — Encounter (HOSPITAL_COMMUNITY): Payer: Self-pay | Admitting: Neurology

## 2018-03-28 DIAGNOSIS — G255 Other chorea: Secondary | ICD-10-CM

## 2018-03-28 DIAGNOSIS — I429 Cardiomyopathy, unspecified: Secondary | ICD-10-CM

## 2018-03-28 LAB — COOXEMETRY PANEL
Carboxyhemoglobin: 1.1 % (ref 0.5–1.5)
METHEMOGLOBIN: 1.4 % (ref 0.0–1.5)
O2 SAT: 73.7 %
Total hemoglobin: 13.5 g/dL (ref 12.0–16.0)

## 2018-03-28 LAB — MULTIPLE MYELOMA PANEL, SERUM
ALBUMIN SERPL ELPH-MCNC: 3 g/dL (ref 2.9–4.4)
ALBUMIN/GLOB SERPL: 0.8 (ref 0.7–1.7)
ALPHA 1: 0.3 g/dL (ref 0.0–0.4)
ALPHA2 GLOB SERPL ELPH-MCNC: 0.8 g/dL (ref 0.4–1.0)
B-Globulin SerPl Elph-Mcnc: 1.1 g/dL (ref 0.7–1.3)
GLOBULIN, TOTAL: 3.8 g/dL (ref 2.2–3.9)
Gamma Glob SerPl Elph-Mcnc: 1.6 g/dL (ref 0.4–1.8)
IGA: 442 mg/dL — AB (ref 64–422)
IGG (IMMUNOGLOBIN G), SERUM: 1909 mg/dL — AB (ref 700–1600)
IgM (Immunoglobulin M), Srm: 53 mg/dL (ref 26–217)
Total Protein ELP: 6.8 g/dL (ref 6.0–8.5)

## 2018-03-28 LAB — CBC
HCT: 38.9 % (ref 36.0–46.0)
Hemoglobin: 13.2 g/dL (ref 12.0–15.0)
MCH: 33.3 pg (ref 26.0–34.0)
MCHC: 33.9 g/dL (ref 30.0–36.0)
MCV: 98.2 fL (ref 78.0–100.0)
PLATELETS: 192 10*3/uL (ref 150–400)
RBC: 3.96 MIL/uL (ref 3.87–5.11)
RDW: 15 % (ref 11.5–15.5)
WBC: 4.5 10*3/uL (ref 4.0–10.5)

## 2018-03-28 LAB — BASIC METABOLIC PANEL
ANION GAP: 8 (ref 5–15)
BUN: 11 mg/dL (ref 8–23)
CALCIUM: 8.2 mg/dL — AB (ref 8.9–10.3)
CO2: 23 mmol/L (ref 22–32)
Chloride: 95 mmol/L — ABNORMAL LOW (ref 98–111)
Creatinine, Ser: 0.8 mg/dL (ref 0.44–1.00)
Glucose, Bld: 116 mg/dL — ABNORMAL HIGH (ref 70–99)
POTASSIUM: 4.8 mmol/L (ref 3.5–5.1)
SODIUM: 126 mmol/L — AB (ref 135–145)

## 2018-03-28 MED ORDER — GADOBENATE DIMEGLUMINE 529 MG/ML IV SOLN
20.0000 mL | Freq: Once | INTRAVENOUS | Status: AC | PRN
  Administered 2018-03-28: 20 mL via INTRAVENOUS

## 2018-03-28 MED ORDER — PHENYTOIN SODIUM EXTENDED 100 MG PO CAPS: 100.0000 mg | ORAL_CAPSULE | Freq: Every day | ORAL | Status: DC

## 2018-03-28 MED ORDER — HYDRALAZINE HCL 50 MG PO TABS
50.0000 mg | ORAL_TABLET | Freq: Three times a day (TID) | ORAL | Status: DC
  Administered 2018-03-28 – 2018-04-01 (×12): 50 mg via ORAL
  Filled 2018-03-28 (×13): qty 1

## 2018-03-28 MED ORDER — PHENYTOIN SODIUM EXTENDED 100 MG PO CAPS
200.0000 mg | ORAL_CAPSULE | Freq: Every day | ORAL | Status: DC
  Administered 2018-03-29 (×2): 100 mg via ORAL
  Administered 2018-03-30 – 2018-03-31 (×2): 200 mg via ORAL
  Filled 2018-03-28 (×6): qty 2

## 2018-03-28 MED ORDER — AMIODARONE HCL IN DEXTROSE 360-4.14 MG/200ML-% IV SOLN
30.0000 mg/h | INTRAVENOUS | Status: DC
  Administered 2018-03-29 – 2018-03-30 (×3): 30 mg/h via INTRAVENOUS
  Filled 2018-03-28 (×5): qty 200

## 2018-03-28 MED ORDER — AMIODARONE LOAD VIA INFUSION
150.0000 mg | Freq: Once | INTRAVENOUS | Status: AC
  Administered 2018-03-28: 150 mg via INTRAVENOUS
  Filled 2018-03-28: qty 83.34

## 2018-03-28 MED ORDER — AMIODARONE HCL IN DEXTROSE 360-4.14 MG/200ML-% IV SOLN
60.0000 mg/h | INTRAVENOUS | Status: DC
  Administered 2018-03-28 (×2): 60 mg/h via INTRAVENOUS
  Filled 2018-03-28 (×2): qty 200

## 2018-03-28 MED ORDER — TORSEMIDE 20 MG PO TABS
40.0000 mg | ORAL_TABLET | Freq: Every day | ORAL | Status: DC
  Administered 2018-03-28 – 2018-03-30 (×3): 40 mg via ORAL
  Filled 2018-03-28 (×3): qty 2

## 2018-03-28 MED ORDER — PHENYTOIN 50 MG PO CHEW
50.0000 mg | CHEWABLE_TABLET | Freq: Three times a day (TID) | ORAL | Status: DC
Start: 2018-03-28 — End: 2018-03-28
  Filled 2018-03-28: qty 1

## 2018-03-28 MED ORDER — PHENYTOIN SODIUM EXTENDED 100 MG PO CAPS: 100.0000 mg | ORAL_CAPSULE | Freq: Three times a day (TID) | ORAL | Status: DC

## 2018-03-28 MED ORDER — AMIODARONE HCL 200 MG PO TABS
200.0000 mg | ORAL_TABLET | Freq: Two times a day (BID) | ORAL | Status: DC
  Administered 2018-03-28: 200 mg via ORAL
  Filled 2018-03-28: qty 1

## 2018-03-28 MED ORDER — PHENYTOIN SODIUM EXTENDED 100 MG PO CAPS
100.0000 mg | ORAL_CAPSULE | Freq: Three times a day (TID) | ORAL | Status: DC
  Filled 2018-03-28: qty 1

## 2018-03-28 MED ORDER — PHENYTOIN SODIUM EXTENDED 100 MG PO CAPS: 100.0000 mg | ORAL_CAPSULE | ORAL | Status: DC

## 2018-03-28 MED ORDER — PHENYTOIN SODIUM EXTENDED 100 MG PO CAPS
100.0000 mg | ORAL_CAPSULE | Freq: Two times a day (BID) | ORAL | Status: DC
  Administered 2018-03-28 – 2018-03-31 (×7): 100 mg via ORAL
  Filled 2018-03-28 (×7): qty 1

## 2018-03-28 MED ORDER — MEPERIDINE HCL 25 MG/ML IJ SOLN
12.5000 mg | Freq: Once | INTRAMUSCULAR | Status: AC
  Administered 2018-03-28: 12.5 mg via INTRAVENOUS
  Filled 2018-03-28: qty 1

## 2018-03-28 NOTE — Care Management (Signed)
Benefit Check ENTREST 97-103 MG BID  COVER- YES  CO-PAY- $ 47.00  TIER- 3 DRUG  PRIOR APPROVAL- YES # [phone]   2. ELIQUIS 5 MG  COVER- YES  CO-PAY- $ 47.00  TIER- 3 DRUG  PRIOR APPROVAL- NO   PREFERRED PHARMACY : YES  [facility]  [place] [facility]  [facility]   Raynald BlendSamantha Analise Glotfelty, RN, BSN [phone]

## 2018-03-28 NOTE — Progress Notes (Signed)
Pt unavailable for EEG at this time per Tammy, RN. Will attempt as schedule permits.

## 2018-03-28 NOTE — Progress Notes (Signed)
Patient in Afib RVR w/HR to 140s sustained; Dr. Gala RomneyBensimhon updated and new orders received to restart IV Amiodarone gtt, bolus and infusion.  Will continue to monitor.

## 2018-03-28 NOTE — Progress Notes (Signed)
Patient is now w/uncontrolled shivering again; wrapped in multiple warm blankets.  Provider notified.

## 2018-03-28 NOTE — Progress Notes (Signed)
Pt still unavailable for EEG, she is still in MRI.

## 2018-03-28 NOTE — Consult Note (Addendum)
Neurology Consultation  Reason for Consult: Abnormal movements  Referring Physician: Dr. Gala Romney  History is obtained from: Daughter and Patient  HPI: [name] Rosales is a 77 y.o. female with history of right/left heart catheterization and coronary angiogram along with coronary stent intervention, hypertension, systolic heart failure.  While in the hospital for her right/left heart catheterization coronary angiogram along with significant diuresis with Lasix resulting and greater than 20 pound loss in fluid weight, it was noted by daughter and hospital staff that patient had abnormal shivering movements.  At first it was thought to be possible myoclonus however that was why neurology was called to consult as it was unclear what type of movements these were.  Apparently patient had similar presentation of abnormal movements back in May when she was at [facility] for CHF exacerbation.  At that time she had a significant amount of diuresis as well - her shivering seemed to be triggered by the diuresis or some other factor associated with the hospitalization at that time.  However, her daughter has noted that the patient has been having these movements intermittently at home prior to that, although significantly less frequent and less prominent when they occur.  While at [facility] in May it was noted that the patient had significant abnormal movements for approximately 5 days but the movements subsequently tapered off.  Patient's daughter states that initially it looks like she has chills but then turns into abnormal jerking movements. The patient feels cold during the episodes and also feels as though she is shivering, but neither the feeling of being cold, nor the shivering subside with regular blankets or even a warming blanket. During consultation patient was clearly having what looks like low to medium amplitude choreiform movements that involve multiple joints and multiple extremities, as well as her  shoulders/torso and neck.  Patient was able to control these movements for a few seconds but then the movements came back.  Patient's daughter states at times it is bad enough that she cannot even take her pills.  The nurse on the floor had given her Demerol which seems to help with the shivering movements, "slowing them down" but does not take them fully away.  The reason Demerol was given was the fact that the nurse thought it was shivering and felt that Demerol could help with this.  ROS: A 14 point ROS was performed and is negative except as noted in the HPI.   Past Medical History:  Diagnosis Date  . Choreiform movements   . Fluid overload   . Heart failure (HCC)   . Hypertension   . Lower extremity pain     No family history on file.   Social History:   reports that she has quit smoking. She has never used smokeless tobacco. Her alcohol and drug histories are not on file.  Medications  Current Facility-Administered Medications:  .  0.9 %  sodium chloride infusion, 250 mL, Intravenous, PRN, Lyn Records, MD .  acetaminophen (TYLENOL) tablet 650 mg, 650 mg, Oral, Q4H PRN, Clegg, Amy D, NP, 650 mg at 03/28/18 1005 .  amiodarone (PACERONE) tablet 200 mg, 200 mg, Oral, BID, Bensimhon, Bevelyn Buckles, MD, 200 mg at 03/28/18 1213 .  amLODipine (NORVASC) tablet 10 mg, 10 mg, Oral, Daily, Bensimhon, Bevelyn Buckles, MD, 10 mg at 03/28/18 1004 .  apixaban (ELIQUIS) tablet 5 mg, 5 mg, Oral, BID, Bensimhon, Bevelyn Buckles, MD, 5 mg at 03/28/18 1004 .  aspirin chewable tablet 81 mg, 81 mg, Oral, Daily,  Lyn Records, MD, 81 mg at 03/28/18 1005 .  atorvastatin (LIPITOR) tablet 80 mg, 80 mg, Oral, q1800, Lyn Records, MD, 80 mg at 03/27/18 1754 .  clopidogrel (PLAVIX) tablet 75 mg, 75 mg, Oral, Q breakfast, Lyn Records, MD, 75 mg at 03/28/18 0719 .  hydrALAZINE (APRESOLINE) injection 10 mg, 10 mg, Intravenous, Q4H PRN, Bensimhon, Bevelyn Buckles, MD, 10 mg at 03/28/18 0748 .  hydrALAZINE (APRESOLINE) tablet 50  mg, 50 mg, Oral, Q8H, Bensimhon, Bevelyn Buckles, MD .  hydrocortisone cream 1 %, , Topical, BID, Clegg, Amy D, NP .  nitroGLYCERIN (NITROSTAT) SL tablet 0.4 mg, 0.4 mg, Sublingual, Q5 min PRN, Bensimhon, Bevelyn Buckles, MD .  ondansetron (ZOFRAN) injection 4 mg, 4 mg, Intravenous, Q6H PRN, Clegg, Amy D, NP, 4 mg at 03/28/18 1016 .  phenytoin (DILANTIN) ER capsule 100 mg, 100 mg, Oral, TID, Makhlouf, Tanya K, RPH, 100 mg at 03/28/18 0719 .  phenytoin (DILANTIN) ER capsule 100 mg, 100 mg, Oral, Daily PRN, Clyda Hurdle, RPH, 100 mg at 03/25/18 0849 .  polyethylene glycol (MIRALAX / GLYCOLAX) packet 17 g, 17 g, Oral, Daily PRN, Bensimhon, Bevelyn Buckles, MD, 17 g at 03/28/18 1005 .  sacubitril-valsartan (ENTRESTO) 97-103 mg per tablet, 1 tablet, Oral, BID, Clegg, Amy D, NP, 1 tablet at 03/28/18 1004 .  sodium chloride flush (NS) 0.9 % injection 10-40 mL, 10-40 mL, Intracatheter, Q12H, Bensimhon, Bevelyn Buckles, MD, 10 mL at 03/28/18 1016 .  sodium chloride flush (NS) 0.9 % injection 10-40 mL, 10-40 mL, Intracatheter, PRN, Bensimhon, Daniel R, MD .  sodium chloride flush (NS) 0.9 % injection 3 mL, 3 mL, Intravenous, Q12H, Lyn Records, MD, 3 mL at 03/28/18 1016 .  sodium chloride flush (NS) 0.9 % injection 3 mL, 3 mL, Intravenous, PRN, Lyn Records, MD .  spironolactone (ALDACTONE) tablet 25 mg, 25 mg, Oral, Daily, Clegg, Amy D, NP, 25 mg at 03/28/18 1005 .  torsemide (DEMADEX) tablet 40 mg, 40 mg, Oral, Daily, Bensimhon, Bevelyn Buckles, MD, 40 mg at 03/28/18 1213 .  traMADol (ULTRAM) tablet 100 mg, 100 mg, Oral, Q12H PRN, Clegg, Amy D, NP, 100 mg at 03/27/18 2314   Exam: Current vital signs: BP (!) 142/54 (BP Location: Left Arm)   Pulse 88   Temp 98.6 F (37 C) (Oral)   Resp 14   Ht 5\' 6"  (1.676 m)   Wt 59.4 kg (130 lb 15.3 oz)   SpO2 96%   BMI 21.14 kg/m  Vital signs in last 24 hours: Temp:  [98.1 F (36.7 C)-99 F (37.2 C)] 98.6 F (37 C) (07/30 1227) Pulse Rate:  [87-94] 88 (07/30 1227) Resp:  [14-21]  14 (07/30 1227) BP: (116-178)/(52-100) 142/54 (07/30 1227) SpO2:  [92 %-96 %] 96 % (07/30 1227) Weight:  [59.4 kg (130 lb 15.3 oz)-60.1 kg (132 lb 8 oz)] 59.4 kg (130 lb 15.3 oz) (07/30 0646)  GENERAL: Awake, alert in NAD HEENT: - Normocephalic and atraumatic,  LUNGS - Clear to auscultation bilaterally with no wheezes CV - S1S2 RRR, no m/r/g, equal pulses bilaterally. ABDOMEN - Soft, nontender, nondistended with normoactive BS Ext: warm, well perfused, intact peripheral pulses, mild edema is noted in lower extremities along with pain to palpation, lower extremities also have a Rubor-like coloring  NEURO:  Mental Status: AA&Ox3  Language: speech is clear.  Naming, repetition, fluency, and comprehension intact. Cranial Nerves: PERRL 2 mm/brisk. EOMI, visual fields full, no facial asymmetry, symmetric facial sensation intact, hearing intact, tongue/uvula/soft  palate midline, normal sternocleidomastoid and trapezius muscle strength. No evidence of tongue atrophy or fibrillations Motor: 4/5.  While examining she exhibits intermittent and asynchronous upper and lower extremity choreiform-like movements.  The upper extremity movements are more prominent than lower extremity.  This also includes her fingers, torso, shoulders, neck and feet. The movements have a dancing and fidgeting-like quality. Muscle bulk is normal Sensation- Intact to light touch bilaterally in the upper extremities. She has significant allodynia from the mid calves to her feet. Temp sensation intact proximally x 4.  Coordination: FTN intact bilaterally without ataxia.   Gait- Able to stand with own power. Slightly wide-based.   Labs I have reviewed labs in epic and the results pertinent to this consultation are:   CBC    Component Value Date/Time   WBC 4.5 03/28/2018 0447   RBC 3.96 03/28/2018 0447   HGB 13.2 03/28/2018 0447   HCT 38.9 03/28/2018 0447   PLT 192 03/28/2018 0447   MCV 98.2 03/28/2018 0447   MCH 33.3  03/28/2018 0447   MCHC 33.9 03/28/2018 0447   RDW 15.0 03/28/2018 0447   LYMPHSABS 0.6 (L) 03/24/2018 1457   MONOABS 0.6 03/24/2018 1457   EOSABS 0.0 03/24/2018 1457   BASOSABS 0.0 03/24/2018 1457    CMP     Component Value Date/Time   NA 126 (L) 03/28/2018 0447   K 4.8 03/28/2018 0447   CL 95 (L) 03/28/2018 0447   CO2 23 03/28/2018 0447   GLUCOSE 116 (H) 03/28/2018 0447   BUN 11 03/28/2018 0447   CREATININE 0.80 03/28/2018 0447   CALCIUM 8.2 (L) 03/28/2018 0447   PROT 7.0 03/24/2018 1457   ALBUMIN 3.1 (L) 03/24/2018 1457   AST 40 03/24/2018 1457   ALT 21 03/24/2018 1457   ALKPHOS 162 (H) 03/24/2018 1457   BILITOT 0.8 03/24/2018 1457   GFRNONAA >60 03/28/2018 0447   GFRAA >60 03/28/2018 0447    Lipid Panel  No results found for: CHOL, TRIG, HDL, CHOLHDL, VLDL, LDLCALC, LDLDIRECT   Imaging I have reviewed the images obtained:  MRI examination of the brain pending    Assessment: 77 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   1. 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 The most frequently associated cancer was small cell lung cancer (SCLC). Lymphoma, bowel, or kidney cancers were also reported. CV2/CRMP5 was the most frequently associated antibody, followed by Hu.  2. Also on the DDx is phenytoin-induced chorea, which is an uncommon side effect found in case reports in the literature. No other medications with chorea as a side effect, such as estrogen, methotrexate, prochlorperazine,  metoclopramide or other dopamine-blocking agents are found on her inpatient or home medications lists.    Recommendations: 1. MRI brain without contrast is in progress 2. Vitamin B12 level 3. Gradually normalize Na levels. Do not correct by more than 10 meq/L per day, or 0.5 meq/L per hour.  4. Decrease phenytoin dose by 50% to 50 mg po TID. Will need to switch from ER formulation to immediate release to make this dose change.  5. If  chorea does not resolve with normalization of her Na level and reduction of phenytoin dose, obtain the following: Heavy metal screen, serum anti Hu antibody level, serum anti-CRMP5 antibody level, CT of chest, abdomen and pelvis.   Electronically signed: Dr. Caryl PinaEric Johnluke Haugen

## 2018-03-28 NOTE — Progress Notes (Signed)
Bahr hugger placed on patient to attempt to control shivering.  Pt is unable to speak d/t shivers.  Body feels warm but Temp 98.1.  WBC 4.5. TSH 1.488  Will continue to monitor closely

## 2018-03-28 NOTE — Progress Notes (Addendum)
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   Pt underwent successful mRCA PCI with DES placement. Will need triple therapy for 4 weeks, and then drop ASA.   Feeling OK this am. Had mild CP last night, but did not feel like her earlier CP. More like a "pinch" after eating [employer]. Weight down another 2 lbs. Denies orthopnea or PND. No lightheadedness. This am having another episode of myoclonus.   ECHO EF ~35% by echo EF 40-45% by cath   [facility] 03/27/18  Mid RCA lesion is 90% stenosed.  Prox LAD to Mid LAD lesion is 20% stenosed.  Findings: RA = 5 RV = 58/8 PA = 56/18 (34) PCW = 16 (v = 25) Fick cardiac output/index = 6.8/4.0 PVR =2.7 WU FA sat = 98% PA sat = 77%, 79% SVC sat = 75%  Objective:   Weight Range:  Vital Signs:   Temp:  [98.1 F (36.7 C)-99 F (37.2 C)] 98.1 F (36.7 C) (07/30 0725) Pulse Rate:  [87-94] 94 (07/30 0408) Resp:  [18-27] 21 (07/30 0408) BP: (116-178)/(52-100) 164/84 (07/30 0408) SpO2:  [92 %-100 %] 95 % (07/30 0408) Weight:  [130 lb 15.3 oz (59.4 kg)-132 lb 8 oz (60.1 kg)] 130 lb 15.3 oz (59.4 kg) (07/30 0646) Last BM Date: 03/24/18  Weight change: Filed Weights   03/27/18 0332 03/27/18 1700 03/28/18 0646  Weight: 133 lb 4.8 oz (60.5 kg) 132 lb 8 oz (60.1 kg) 130 lb 15.3 oz (59.4 kg)    Intake/Output:   Intake/Output Summary (Last 24 hours) at 03/28/2018 0805 Last data filed at 03/28/2018 0600 Gross per 24 hour  Intake 210.4 ml  Output 1200 ml  Net -989.6 ml     Physical Exam   General: Elderly and chronically ill appearing.  HEENT: Normal anicteric  Neck: Supple. JVP not elevated. Carotids 2+ bilat; no bruits. No thyromegaly or nodule noted. Cor: PMI nondisplaced. RRR, No M/G/R noted Lungs: CTAB, normal effort. No  wheeze Abdomen: Soft, non-tender, non-distended, no HSM. No bruits or masses. +BS  Extremities: No cyanosis, clubbing, or rash. R and LLE no edema. UNNA boots.  Neuro: Alert & orientedx3, cranial nerves grossly intact. moves all 4 extremities w/o difficulty. Affect pleasant + myoclonus  Telemetry   NSR 80-90s, LBBB, personally reviewed.   Labs   Basic Metabolic Panel: Recent Labs  Lab 03/24/18 1457 03/25/18 0211 03/25/18 1847 03/26/18 0346 03/27/18 0219 03/28/18 0447  NA 133* 132*  --  129* 127* 126*  K 3.9 4.0 3.6 3.9 4.9 4.8  CL 99 95*  --  92* 92* 95*  CO2 26 27  --  27 22 23   GLUCOSE 121* 104*  --  153* 119* 116*  BUN 12 11  --  10 7* 11  CREATININE 0.72 0.66  --  0.79 0.78 0.80  CALCIUM 8.3* 8.3*  --  8.1* 8.4* 8.2*  MG 1.9  --  1.7  --   --   --     Liver Function Tests: Recent Labs  Lab 03/24/18 1457  AST 40  ALT 21  ALKPHOS 162*  BILITOT 0.8  PROT 7.0  ALBUMIN 3.1*   No results for input(s): LIPASE, AMYLASE in the last 168 hours. No results for input(s): AMMONIA in the last 168 hours.  CBC: Recent Labs  Lab 03/24/18 1457 03/27/18 0702 03/28/18 0447  WBC 2.9* 5.0 4.5  NEUTROABS 1.7  --   --   HGB 12.2 13.7 13.2  HCT 37.5 40.4 38.9  MCV 101.1* 99.0 98.2  PLT 174 199 192    Cardiac Enzymes: Recent Labs  Lab 03/24/18 1457 03/24/18 2031 03/25/18 0211  TROPONINI <0.03 <0.03 <0.03    BNP: BNP (last 3 results) Recent Labs    03/24/18 1457  BNP 2,425.8*    ProBNP (last 3 results) No results for input(s): PROBNP in the last 8760 hours.    Other results:  Imaging: No results found.   Medications:     Scheduled Medications: . amLODipine  10 mg Oral Daily  . apixaban  5 mg Oral BID  . aspirin  81 mg Oral Daily  . atorvastatin  80 mg Oral q1800  . clopidogrel  75 mg Oral Q breakfast  . diazepam  2.5 mg Oral Once  . hydrALAZINE  25 mg Oral Q8H  . hydrocortisone cream   Topical BID  . phenytoin  100 mg Oral TID  . potassium  chloride  40 mEq Oral BID  . sacubitril-valsartan  1 tablet Oral BID  . sodium chloride flush  10-40 mL Intracatheter Q12H  . sodium chloride flush  3 mL Intravenous Q12H  . spironolactone  25 mg Oral Daily    Infusions: . sodium chloride    . amiodarone 30 mg/hr (03/28/18 0749)    PRN Medications: sodium chloride, acetaminophen, hydrALAZINE, nitroGLYCERIN, ondansetron (ZOFRAN) IV, phenytoin, polyethylene glycol, sodium chloride flush, sodium chloride flush, traMADol   Assessment:   77 y/o woman admitted 7/26 with acute on chronic systolic HF  Plan/Discussion:    1. 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 Differential also includes ischemic CM, infiltrative CM (amyloid) and CTD-related PAH/HF. Admitted with massive volume overload and NYHA IIIB-IV symptoms - Echo [facility] 5/19 EF ~40% 2-3+ MR  - Echo 03/25/18 EF 35%  - Weight down 22 lbs overall with IV lasix.  - CVP 7-8 this am.  She was on 0.5 mg Bumex BID at home, will discuss with MD.  - Co-ox 73.7% this am.  - Continue UNNA boots - Continue Entresto 97/103.  - Continue carvedilol 3.125 bid - Continue spiro 25 mg daily - Continue amlodipine 10 mg daily for now.  - Continue hydralazine 25 tid can titrate up as needed - cMRI ordered for today.  2. Severe HTN - Improving. Max SBP 160s in past 24 hrs.  - Continue amlodipine. Meds as above.  - PRN IV hydralazine for SBP > 160 3. 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  - Continue ASA, plavix, and Eliquis.  - Will drop ASA after 1 month - Will need plavix for at least 6 months. - Pt did have recurrent CP after dinner last night that resolved with 2 NTGs. 4. PAF, new  onset - NSR this am on amio gtt.  - She is now on Eliquis.  - This patients CHA2DS2-VASc Score and unadjusted Ischemic Stroke Rate (% per year) is equal to 9.7 % stroke rate/year from a score of 6 5. Facial rash - Reviewed with Dermatology. 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 Need titer - Continue Hydrocortisone  2.5% to face and neck 6. LBBB - Consider CRT as needed 7. OSA - Continue CPAP qhs. 8. Migraines - Contnue dilantin 9. Hyponatremia - Na 126.  - Free water restrict.   Length of Stay: [address]4  Luane SchoolMichael Andrew Tillery, PA-C  03/28/2018, 8:05 AM  Advanced Heart Failure Team Pager [phone] (M-F; 7a - 4p)  Please contact [facility] for night-coverage after hours (4p -7a ) and weekends on [email or web address]  Patient seen and examined with the above-signed Advanced Practice Provider and/or Housestaff. I personally reviewed laboratory data, imaging studies and relevant notes. I independently examined the patient and formulated the important aspects of the plan. I have edited the note to reflect any of my changes or salient points. I have personally discussed the plan with the patient and/or family.  Cath yesterday with severe 1v CAD s/p stenting of RCA. RH pressures looked ok. Remains in NSR on amio. This am having another episode of prolonged myoclonus. Have asked Neurology to see. BP still high.   Will change amio and diuretics to po. Continue to titrate hydralazine. CR to see. Needs ANA titers. cMRI later today if myoclonus resolves.   Arvilla Meresaniel Bensimhon, MD  11:24 AM

## 2018-03-29 ENCOUNTER — Inpatient Hospital Stay (HOSPITAL_COMMUNITY): Payer: Medicare PPO

## 2018-03-29 ENCOUNTER — Encounter (HOSPITAL_COMMUNITY): Payer: Self-pay | Admitting: Emergency Medicine

## 2018-03-29 DIAGNOSIS — I639 Cerebral infarction, unspecified: Secondary | ICD-10-CM

## 2018-03-29 DIAGNOSIS — R0600 Dyspnea, unspecified: Secondary | ICD-10-CM

## 2018-03-29 DIAGNOSIS — R259 Unspecified abnormal involuntary movements: Secondary | ICD-10-CM

## 2018-03-29 LAB — COOXEMETRY PANEL
CARBOXYHEMOGLOBIN: 1 % (ref 0.5–1.5)
Carboxyhemoglobin: 1.3 % (ref 0.5–1.5)
METHEMOGLOBIN: 1.4 % (ref 0.0–1.5)
Methemoglobin: 0.9 % (ref 0.0–1.5)
O2 SAT: 70.6 %
O2 SAT: 60.6 %
TOTAL HEMOGLOBIN: 12.8 g/dL (ref 12.0–16.0)
Total hemoglobin: 12.7 g/dL (ref 12.0–16.0)

## 2018-03-29 LAB — ENA+DNA/DS+ANTICH+CENTRO+JO...
CHROMATIN AB SERPL-ACNC: 0.6 AI (ref 0.0–0.9)
Centromere Ab Screen: <0.2 AI (ref 0.0–0.9)
Ribonucleic Protein: <0.2 AI (ref 0.0–0.9)
SSB (La) (ENA) Antibody, IgG: >8 AI — ABNORMAL HIGH (ref 0.0–0.9)
ds DNA Ab: <1 IU/mL (ref 0–9)

## 2018-03-29 LAB — VITAMIN B12: Vitamin B-12: 287 pg/mL (ref 180–914)

## 2018-03-29 LAB — HEAVY METALS, BLOOD
ARSENIC: 4 ug/L (ref 2–23)
Lead: 1 ug/dL (ref 0–4)
Mercury: NOT DETECTED ug/L (ref 0.0–14.9)

## 2018-03-29 LAB — ANCA TITERS

## 2018-03-29 LAB — CBC
HCT: 36.9 % (ref 36.0–46.0)
Hemoglobin: 12.3 g/dL (ref 12.0–15.0)
MCH: 33 pg (ref 26.0–34.0)
MCHC: 33.3 g/dL (ref 30.0–36.0)
MCV: 98.9 fL (ref 78.0–100.0)
PLATELETS: 183 10*3/uL (ref 150–400)
RBC: 3.73 MIL/uL — ABNORMAL LOW (ref 3.87–5.11)
RDW: 15.1 % (ref 11.5–15.5)
WBC: 3.9 10*3/uL — AB (ref 4.0–10.5)

## 2018-03-29 LAB — ANA W/REFLEX IF POSITIVE: Anti Nuclear Antibody(ANA): POSITIVE — AB

## 2018-03-29 LAB — IGG: IGG (IMMUNOGLOBIN G), SERUM: 1800 mg/dL — AB (ref 700–1600)

## 2018-03-29 MED ORDER — IOHEXOL 300 MG/ML  SOLN
100.0000 mL | Freq: Once | INTRAMUSCULAR | Status: AC | PRN
  Administered 2018-03-29: 100 mL via INTRAVENOUS

## 2018-03-29 MED ORDER — DIAZEPAM 5 MG PO TABS
5.0000 mg | ORAL_TABLET | Freq: Once | ORAL | Status: AC
  Administered 2018-03-29: 5 mg via ORAL
  Filled 2018-03-29: qty 1

## 2018-03-29 MED ORDER — LORAZEPAM 2 MG/ML IJ SOLN
1.0000 mg | Freq: Once | INTRAMUSCULAR | Status: AC
  Administered 2018-03-29: 1 mg via INTRAVENOUS

## 2018-03-29 MED ORDER — AMIODARONE IV BOLUS ONLY 150 MG/100ML
150.0000 mg | Freq: Once | INTRAVENOUS | Status: AC
  Administered 2018-03-29: 150 mg via INTRAVENOUS

## 2018-03-29 MED ORDER — LORAZEPAM 2 MG/ML IJ SOLN
INTRAMUSCULAR | Status: AC
  Filled 2018-03-29: qty 1

## 2018-03-29 NOTE — Progress Notes (Signed)
RN was called to the room. Pt stated she was cold and was shivering. Complaining of pain in both lower extremities. Then started to have abnormal jerking movements. Warming blankets given. Feeling better after 5 min, back to baseline. On-call cards made aware. Stated "she will be f/o with neurologist today".

## 2018-03-29 NOTE — Progress Notes (Signed)
Notified MD made him aware of uncontrolled heart rate up in the 130's Afib. EKG done patient did c/o of left side chest pain "pinching like"  And one NTG x1 given with relief. Patient is on  Amiodarone drip MD didn't want to change at this time due to EF the plan is to continue to monitor heart rate if continues to rise above 130's MD may consider bolus. Family at bedside. I will continue to monitor.

## 2018-03-29 NOTE — Progress Notes (Signed)
Subjective: At about 6:20 AM, patient had another episode of feeling cold and shivering, in addition to pain in both lower extremities. She then started to have abnormal jerking movements. After warming blankets were given, she was feeling better after 5 min and was back to baseline.   Objective: Current vital signs: BP (!) 155/111   Pulse 79   Temp (!) 97.5 F (36.4 C) (Axillary)   Resp 17   Ht _0  (1.676 m)   Wt 58.3 kg (128 lb 8 oz)   SpO2 92%   BMI 20.74 kg/m  Vital signs in last 24 hours: Temp:  [97.5 F (36.4 C)-98.8 F (37.1 C)] 97.5 F (36.4 C) (07/31 0400) Pulse Rate:  [79-116] 79 (07/31 0400) Resp:  [14-23] 17 (07/31 0400) BP: (122-155)/(0-111) 155/111 (07/31 0636) SpO2:  [92 %-96 %] 92 % (07/31 0400) FiO2 (%):  [2 %] 2 % (07/30 1950) Weight:  [58.3 kg (128 lb 8 oz)] 58.3 kg (128 lb 8 oz) (07/31 0500)  Intake/Output from previous day: 07/30 0701 - 07/31 0700 In: 33.3 [I.V.:33.3] Out: 1800 [Urine:1800] Intake/Output this shift: Total I/O In: -  Out: 400 [Urine:400] Nutritional status:  Diet Order           Diet Heart Room service appropriate? Yes; Fluid consistency: Thin  Diet effective now          GENERAL: Awake, alert in NAD HEENT: - Normocephalic and atraumatic  LUNGS - Respirations unlabored Ext: warm, well perfused, mild edema is noted in lower extremities along with pain to palpation, lower extremities also have a Rubor-like coloring  NEURO:  Mental Status: AA&Ox3  Speech fluent with intact comprehension.  CN: PERRL, EOMI, no nystagmus, no facial asymmetry, hearing intact to voice Motor: 4/5 x 4. No asymmetry. No choreiform movements seen today.   Cerebellar: FTN intact bilaterally without ataxia.   Gait- Deferred    Lab Results: Results for orders placed or performed during the hospital encounter of 03/24/18 (from the past 48 hour(s))  I-STAT 3, arterial blood gas (G3+)     Status: None   Collection Time: 03/27/18  7:56 AM  Result Value  Ref Range   pH, Arterial 7.412 7.350 - 7.450   pCO2 arterial 38.9 32.0 - 48.0 mmHg   pO2, Arterial 97.0 83.0 - 108.0 mmHg   Bicarbonate 24.8 20.0 - 28.0 mmol/L   TCO2 26 22 - 32 mmol/L   O2 Saturation 98.0 %   Patient temperature HIDE    Sample type ARTERIAL   I-STAT 3, venous blood gas (G3P V)     Status: Abnormal   Collection Time: 03/27/18  8:01 AM  Result Value Ref Range   pH, Ven 7.413 7.250 - 7.430   pCO2, Ven 40.6 (L) 44.0 - 60.0 mmHg   pO2, Ven 43.0 32.0 - 45.0 mmHg   Bicarbonate 25.9 20.0 - 28.0 mmol/L   TCO2 27 22 - 32 mmol/L   O2 Saturation 79.0 %   Acid-Base Excess 1.0 0.0 - 2.0 mmol/L   Patient temperature HIDE    Sample type VENOUS   I-STAT 3, venous blood gas (G3P V)     Status: Abnormal   Collection Time: 03/27/18  8:13 AM  Result Value Ref Range   pH, Ven 7.413 7.250 - 7.430   pCO2, Ven 39.1 (L) 44.0 - 60.0 mmHg   pO2, Ven 41.0 32.0 - 45.0 mmHg   Bicarbonate 25.0 20.0 - 28.0 mmol/L   TCO2 26 22 - 32 mmol/L  O2 Saturation 77.0 %   Patient temperature HIDE    Sample type VENOUS   I-STAT 3, venous blood gas (G3P V)     Status: Abnormal   Collection Time: 03/27/18  8:14 AM  Result Value Ref Range   pH, Ven 7.401 7.250 - 7.430   pCO2, Ven 40.1 (L) 44.0 - 60.0 mmHg   pO2, Ven 41.0 32.0 - 45.0 mmHg   Bicarbonate 24.9 20.0 - 28.0 mmol/L   TCO2 26 22 - 32 mmol/L   O2 Saturation 76.0 %   Patient temperature HIDE    Sample type VENOUS   I-STAT 3, venous blood gas (G3P V)     Status: Abnormal   Collection Time: 03/27/18  8:23 AM  Result Value Ref Range   pH, Ven 7.384 7.250 - 7.430   pCO2, Ven 40.6 (L) 44.0 - 60.0 mmHg   pO2, Ven 41.0 32.0 - 45.0 mmHg   Bicarbonate 24.2 20.0 - 28.0 mmol/L   TCO2 25 22 - 32 mmol/L   O2 Saturation 75.0 %   Acid-base deficit 1.0 0.0 - 2.0 mmol/L   Patient temperature HIDE    Sample type VENOUS   POCT Activated clotting time     Status: None   Collection Time: 03/27/18  9:04 AM  Result Value Ref Range   Activated Clotting  Time 263 seconds  POCT Activated clotting time     Status: None   Collection Time: 03/27/18  9:19 AM  Result Value Ref Range   Activated Clotting Time 312 seconds  POCT Activated clotting time     Status: None   Collection Time: 03/27/18  9:42 AM  Result Value Ref Range   Activated Clotting Time 252 seconds  POCT Activated clotting time     Status: None   Collection Time: 03/27/18 11:19 AM  Result Value Ref Range   Activated Clotting Time 224 seconds  POCT Activated clotting time     Status: None   Collection Time: 03/27/18 12:32 PM  Result Value Ref Range   Activated Clotting Time 186 seconds  CBC     Status: None   Collection Time: 03/28/18  4:47 AM  Result Value Ref Range   WBC 4.5 4.0 - 10.5 K/uL   RBC 3.96 3.87 - 5.11 MIL/uL   Hemoglobin 13.2 12.0 - 15.0 g/dL   HCT 38.9 36.0 - 46.0 %   MCV 98.2 78.0 - 100.0 fL   MCH 33.3 26.0 - 34.0 pg   MCHC 33.9 30.0 - 36.0 g/dL   RDW 15.0 11.5 - 15.5 %   Platelets 192 150 - 400 K/uL    Comment: Performed at [facility], [address] [place], [place] [address]  Basic metabolic panel     Status: Abnormal   Collection Time: 03/28/18  4:47 AM  Result Value Ref Range   Sodium 126 (L) 135 - 145 mmol/L   Potassium 4.8 3.5 - 5.1 mmol/L   Chloride 95 (L) 98 - 111 mmol/L   CO2 23 22 - 32 mmol/L   Glucose, Bld 116 (H) 70 - 99 mg/dL   BUN 11 8 - 23 mg/dL   Creatinine, Ser 0.80 0.44 - 1.00 mg/dL   Calcium 8.2 (L) 8.9 - 10.3 mg/dL   GFR calc non Af Amer >60 >60 mL/min   GFR calc Af Amer >60 >60 mL/min    Comment: (NOTE) The eGFR has been calculated using the CKD EPI equation. This calculation has not been validated in all  clinical situations. eGFR's persistently <60 mL/min signify possible Chronic Kidney Disease.    Anion gap 8 5 - 15    Comment: Performed at [facility] [address], [place], [place] [address]  .Cooxemetry Panel (carboxy, met, total hgb, O2 sat)     Status: None   Collection Time: 03/28/18  4:50 AM   Result Value Ref Range   Total hemoglobin 13.5 12.0 - 16.0 g/dL   O2 Saturation 73.7 %   Carboxyhemoglobin 1.1 0.5 - 1.5 %   Methemoglobin 1.4 0.0 - 1.5 %  IgG     Status: Abnormal   Collection Time: 03/28/18  4:35 PM  Result Value Ref Range   IgG (Immunoglobin G), Serum 1,800 (H) 700 - 1,600 mg/dL    Comment: (NOTE) Performed At: [facility] [place], [place] [id number] Rush Farmer MD YY:[id number]   CBC     Status: Abnormal   Collection Time: 03/29/18  2:37 AM  Result Value Ref Range   WBC 3.9 (L) 4.0 - 10.5 K/uL   RBC 3.73 (L) 3.87 - 5.11 MIL/uL   Hemoglobin 12.3 12.0 - 15.0 g/dL   HCT 36.9 36.0 - 46.0 %   MCV 98.9 78.0 - 100.0 fL   MCH 33.0 26.0 - 34.0 pg   MCHC 33.3 30.0 - 36.0 g/dL   RDW 15.1 11.5 - 15.5 %   Platelets 183 150 - 400 K/uL    Comment: Performed at [facility], [place] [address] [place], [place] [address]    Recent Results (from the past 240 hour(s))  MRSA PCR Screening     Status: None   Collection Time: 03/24/18  2:40 PM  Result Value Ref Range Status   MRSA by PCR NEGATIVE NEGATIVE Final    Comment:        The GeneXpert MRSA Assay (FDA approved for NASAL specimens only), is one component of a comprehensive MRSA colonization surveillance program. It is not intended to diagnose MRSA infection nor to guide or monitor treatment for MRSA infections. Performed at [facility], [place] [address], [place], [place] [address]   Culture, blood (Routine X 2) w Reflex to ID Panel     Status: None (Preliminary result)   Collection Time: 03/25/18  8:50 PM  Result Value Ref Range Status   Specimen Description BLOOD LEFT ANTECUBITAL  Final   Special Requests   Final    BOTTLES DRAWN AEROBIC AND ANAEROBIC Blood Culture adequate volume   Culture   Final    NO GROWTH 3 DAYS Performed at [facility], [place] [address] [place], [place] [address]    Report Status PENDING  Incomplete  Culture, blood (Routine X 2) w Reflex  to ID Panel     Status: None (Preliminary result)   Collection Time: 03/25/18  8:52 PM  Result Value Ref Range Status   Specimen Description BLOOD LEFT ARM  Final   Special Requests   Final    BOTTLES DRAWN AEROBIC ONLY Blood Culture results may not be optimal due to an inadequate volume of blood received in culture bottles   Culture   Final    NO GROWTH 3 DAYS Performed at [facility], [place] [address], [place],  [address]    Report Status PENDING  Incomplete    Lipid Panel No results for input(s): CHOL, TRIG, HDL, CHOLHDL, VLDL, LDLCALC in the last 72 hours.  Studies/Results: Mr Brain Wo Contrast  Result Date:  03/28/2018 CLINICAL DATA:  Chorea movements EXAM: MRI HEAD WITHOUT CONTRAST TECHNIQUE: Multiplanar, multiecho pulse sequences of the brain and surrounding structures were obtained without intravenous contrast. COMPARISON:  None. FINDINGS: Brain: Small 5 mm area of acute infarct in the left lateral occipital lobe. No other acute infarct. Mild atrophy. Extensive chronic microvascular ischemic changes in the white matter and pons. Negative for hemorrhage or mass. No fluid collection or midline shift. Vascular: Normal arterial flow voids Skull and upper cervical spine: Negative Sinuses/Orbits: Mucosal edema in the paranasal sinuses. This is most prominent in the right sphenoid sinus. Bilateral cataract removal. 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 Electronically Signed   By: Franchot Gallo M.D.   On: 03/28/2018 15:12   Mr Cardiac Morphology W Wo Contrast  Result Date: 03/28/2018 CLINICAL DATA:  Cardiomyopathy of uncertain etiology. EXAM: CARDIAC MRI TECHNIQUE: The patient was scanned on a 1.5 Tesla GE magnet. A dedicated cardiac coil was used. Functional imaging was done using Fiesta sequences. 2,3, and 4 chamber views were done to assess for RWMA's. Modified Simpson's rule using a short  axis stack was used to calculate an ejection fraction on a dedicated work Conservation officer, nature. The patient received 28 cc of Multihance. After 10 minutes inversion recovery sequences were used to assess for infiltration and scar tissue. FINDINGS: Difficult study, patient had a hard time holding her breath so images were not ideal. Limited images of the lung fields showed no gross abnormalities. Small pericardial effusion. Normal left ventricular size with mild LV hypertrophy. Septal-lateral dyssynchrony consistent with LBBB. EF 40%, diffuse hypokinesis. Mildly dilated right ventricle with moderate systolic dysfunction, EF 31%. Severe left atrial enlargement. Moderate right atrial enlargement. Images were too poor to comment on degree of valvular regurgitation though there did appear to be mitral and tricuspid regurgitation. On delayed enhancement imaging, there was no significant late gadolinium enhancement (LGE) noted. Measurements: LVEDV 173 mL LVSV 69 mL LVEF 40% RVEDV 144 mL RVSV 46 mL RVEF 32% IMPRESSION: 1. Normal left ventricular size with mild LV hypertrophy. EF 40%, diffuse hypokinesis with septal-lateral dyssynchrony. 2. Mildly dilated RV with moderate decreased systolic function, EF 51%. 3.  Biatrial enlargement. 4. No myocardial LGE, so no definitive evidence for prior MI, infiltrative disease, or myocarditis. Difficult study due to respiratory artifact. Dalton Mclean Electronically Signed   By: Loralie Champagne M.D.   On: 03/28/2018 21:12    Medications:  Scheduled: . amLODipine  10 mg Oral Daily  . apixaban  5 mg Oral BID  . aspirin  81 mg Oral Daily  . atorvastatin  80 mg Oral q1800  . clopidogrel  75 mg Oral Q breakfast  . hydrALAZINE  50 mg Oral Q8H  . hydrocortisone cream   Topical BID  . phenytoin  100 mg Oral BID AC  . phenytoin  200 mg Oral Q breakfast  . sacubitril-valsartan  1 tablet Oral BID  . sodium chloride flush  10-40 mL Intracatheter Q12H  . sodium chloride flush   3 mL Intravenous Q12H  . spironolactone  25 mg Oral Daily  . torsemide  40 mg Oral Daily   Continuous: . sodium chloride    . amiodarone 30 mg/hr (03/29/18 0706)   MRI brain: Small acute infarct left lateral occipital lobe Atrophy with extensive chronic microvascular ischemia.  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   1. 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 The most frequently associated cancer was small cell lung cancer (SCLC). Lymphoma, bowel, or kidney cancers were also reported. CV2/CRMP5 was the most frequently associated antibody, followed by Hu.  2. Also on the DDx is phenytoin-induced chorea, which is an uncommon side effect found in case reports in the literature. No other medications with chorea as a side effect, such as estrogen, methotrexate, prochlorperazine, metoclopramide or other dopamine-blocking agents are found on her inpatient or home medications lists.  3. MRI brain showed no findings to explain her chorea. 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 DDx for etiology includes cardioembolic given atrial fibrillation and CHF with low EF, artery to artery embolization, in situ thrombosis and small vessel disease. She is already on atorvastatin, Plavix, ASA and apixaban per Cardiology orders, which would  constitute maximal medical therapy for stroke prevention. Imaging of the vessels of the head and neck is indicated to assess for possible critical stenosis.  4. Of note, son at the bedside today feels that his mother's choreiform movements and shivering spells are most likely stress-related.    Recommendations: 1. MRA head and carotid ultrasound (ordered). Valium 5 mg po prior to MRA.  2. Vitamin B12 level (ordered) 3. Gradually normalize Na levels. Do not correct by more than 10 meq/L per day, or 0.5 meq/L per hour.  4. Decrease phenytoin dose by 50% to 50 mg po TID. Will need to switch from ER formulation to immediate release to make this dose change. This medication change was ordered yesterday.  5. If chorea does not resolve with normalization of her Na level and reduction of phenytoin dose, obtain the following: Heavy metal screen, serum anti Hu antibody level, serum anti-CRMP5 antibody level, CT of chest, abdomen and pelvis.    LOS: 5 days   _0  signed: Dr. Kerney Elbe 03/29/2018  7:25 AM

## 2018-03-29 NOTE — Procedures (Signed)
ELECTROENCEPHALOGRAM REPORT   Patient: [name] Rosales       Room #: 2C01C EEG No. ID:  Age: 77 y.o.        Sex: female Referring Physician: Otelia LimesLindzen Report Date:  03/29/2018        Interpreting Physician: Thana FarrEYNOLDS, Darnise Montag  History: [name] is an 77 y.o. female with abnormal involuntary movements  Medications:  Amlodipine, Eliquis, ASA, Lipitor, Plavix, Dilantin, Entresto, Aldactone, Demadex, Amiodarone  Conditions of Recording:  This is a 21 channel routine scalp EEG performed with bipolar and monopolar montages arranged in accordance to the international 10/20 system of electrode placement. One channel was dedicated to EKG recording.  The patient is in the awake, drowsy and asleep states.  Description:  The waking background activity consists of a low voltage, symmetrical, fairly well organized, 9 Hz alpha activity, seen from the parieto-occipital and posterior temporal regions.  Low voltage fast activity, poorly organized, is seen anteriorly and is at times superimposed on more posterior regions.  A mixture of theta and alpha rhythms are seen from the central and temporal regions. The patient drowses with slowing to irregular, low voltage theta and beta activity.   The patient goes in to a light sleep with symmetrical sleep spindles, vertex central sharp transients and irregular slow activity.  There were multiple episodes of head movement during the recording with no change in the background rhythm during their occurrence.  No epileptiform activity is noted.   Hyperventilation and intermittent photic stimulation were not performed.   IMPRESSION: Normal electroencephalogram, awake and asleep. There are no focal lateralizing or epileptiform features.  Episodes of head movement during the reocording were without epileptiform correlate.     Thana FarrLeslie Wright Gravely, MD Neurology [phone] 03/29/2018, 3:43 PM

## 2018-03-29 NOTE — Progress Notes (Signed)
EEG completed; results pending.    

## 2018-03-29 NOTE — Progress Notes (Signed)
Bilateral carotid duplex completed. 1% to 39% ICA stenosis. Vertebral artery flow is antegrade. Graybar ElectricVirginia Shawn Rosales, RVS  03/29/2018 12:28 PM

## 2018-03-29 NOTE — Progress Notes (Addendum)
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   Pt underwent successful mRCA PCI with DES placement. Will need triple therapy for 4 weeks, and then drop ASA.   Seen by neurology 03/28/18 for intermittent "uncontrolled shaking/tremors". MRI brain as below.   Back into Afib overnight. Restarted on IV amiodarone. Temporarily converted to NSR but back in Afib this am.   Feeling OK this am. Had a small episode of her tremors, but was better within 10 minutes with deep breathing and warm blankets.  Denies SOB or lightheadedness. No CP. R arm hematoma improving   MRI brain 03/28/18  1. 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 Atrophy with extensive chronic microvascular ischemia  cMRI 03/28/18 1. Normal LV size with mild LV Hypertrophy. EF 40% with septal-lateral dyssynchrony and diffuse HK. 2. Mildly dilated RV with moderately decreased function, EF 32% 3. BAE 4. No myocardial LGE.  5. Noted to be difficult study due to resp artificat  ECHO EF ~35% by echo EF 40-45% by cath   [facility] 03/27/18  Mid RCA lesion is 90% stenosed.  Prox LAD to Mid LAD lesion is 20% stenosed.  Findings: RA = 5 RV = 58/8 PA = 56/18 (34) PCW = 16 (v = 25) Fick cardiac output/index = 6.8/4.0 PVR =2.7 WU FA sat = 98% PA sat = 77%, 79% SVC sat = 75%  Objective:   Weight Range:  Vital Signs:   Temp:  [97.5 F (36.4 C)-98.8 F (37.1 C)] 97.5 F (36.4 C) (07/31 0400) Pulse Rate:  [79-116] 79 (07/31 0400) Resp:  [14-23] 17 (07/31 0400) BP: (122-155)/(0-111) 155/111 (07/31 0636) SpO2:  [92 %-96 %] 92 % (07/31 0400) FiO2 (%):  [2 %] 2 % (07/30 1950) Weight:  [128 lb 8 oz (58.3 kg)] 128 lb 8 oz (58.3 kg) (07/31 0500) Last BM Date: 03/24/18  Weight change: Filed Weights   03/27/18 1700  03/28/18 0646 03/29/18 0500  Weight: 132 lb 8 oz (60.1 kg) 130 lb 15.3 oz (59.4 kg) 128 lb 8 oz (58.3 kg)    Intake/Output:   Intake/Output Summary (Last 24 hours) at 03/29/2018 0741 Last data filed at 03/29/2018 2536 Gross per 24 hour  Intake 33.25 ml  Output 2200 ml  Net -2166.75 ml     Physical Exam   General: 77 and chronically ill appearing.  HEENT: Normal anicteric  Neck: Supple. JVP 6-7 cm. Carotids 2+ bilat; no bruits. No thyromegaly or nodule noted. Cor: PMI nondisplaced. Irregularly irregular. No M/G/R noted Lungs: CTAB, normal effort. No wheeze Abdomen: Soft, non-tender, non-distended, no HSM. No bruits or masses. +BS  Extremities: No cyanosis, clubbing, or rash. R and LLE no edema. R hand ecchymosis and swelling Neuro: Alert & orientedx3, cranial nerves grossly intact. moves all 4 extremities w/o difficulty. Affect pleasant.   Telemetry   Back into Afib 77 100-110s at approximately 4:40 this am, personally reviewed.   Labs   Basic Metabolic Panel: Recent Labs  Lab 03/24/18 1457 03/25/18 0211 03/25/18 1847 03/26/18 0346 03/27/18 0219 03/28/18 0447  NA 133* 132*  --  129* 127* 126*  K 3.9 4.0 3.6 3.9 4.9 4.8  CL 99 95*  --  92* 92* 95*  CO2 26 27  --  27 22 23   GLUCOSE 121* 104*  --  153* 119* 116*  BUN 12 11  --  10 7* 11  CREATININE 0.72 0.66  --  0.79 0.78 0.80  CALCIUM 8.3* 8.3*  --  8.1* 8.4* 8.2*  MG 1.9  --  1.7  --   --   --     Liver Function Tests: Recent Labs  Lab 03/24/18 1457  AST 40  ALT 21  ALKPHOS 162*  BILITOT 0.8  PROT 7.0  ALBUMIN 3.1*   No results for input(s): LIPASE, AMYLASE in the last 168 hours. No results for input(s): AMMONIA in the last 168 hours.  CBC: Recent Labs  Lab 03/24/18 1457 03/27/18 0702 03/28/18 0447 03/29/18 0237  WBC 2.9* 5.0 4.5 3.9*  NEUTROABS 1.7  --   --   --   HGB 12.2 13.7 13.2 12.3  HCT 37.5 40.4 38.9 36.9  MCV 101.1* 99.0 98.2 98.9  PLT 174 199 192 183    Cardiac  Enzymes: Recent Labs  Lab 03/24/18 1457 03/24/18 2031 03/25/18 0211  TROPONINI <0.03 <0.03 <0.03    BNP: BNP (last 3 results) Recent Labs    03/24/18 1457  BNP 2,425.8*    ProBNP (last 3 results) No results for input(s): PROBNP in the last 8760 hours.    Other results:  Imaging: Mr Brain Wo Contrast  Result Date: 03/28/2018 CLINICAL DATA:  Chorea movements EXAM: MRI HEAD WITHOUT CONTRAST TECHNIQUE: Multiplanar, multiecho pulse sequences of the brain and surrounding structures were obtained without intravenous contrast. COMPARISON:  None. FINDINGS: Brain: Small 5 mm area of acute infarct in the left lateral occipital lobe. No other acute infarct. Mild atrophy. Extensive chronic microvascular ischemic changes in the white matter and pons. Negative for hemorrhage or mass. No fluid collection or midline shift. Vascular: Normal arterial flow voids Skull and upper cervical spine: Negative Sinuses/Orbits: Mucosal edema in the paranasal sinuses. This is most prominent in the right sphenoid sinus. Bilateral cataract removal. 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 Electronically Signed   By: Marlan Palau M.D.   On: 03/28/2018 15:12   Mr Cardiac Morphology W Wo Contrast  Result Date: 03/28/2018 CLINICAL DATA:  Cardiomyopathy of uncertain etiology. EXAM: CARDIAC MRI TECHNIQUE: The patient was scanned on a 1.5 Tesla GE magnet. A dedicated cardiac coil was used. Functional imaging was done using Fiesta sequences. 2,3, and 4 chamber views were done to assess for RWMA's. Modified Simpson's rule using a short axis stack was used to calculate an ejection fraction on a dedicated work [employer]. The patient received 28 cc of Multihance. After 10 minutes inversion recovery sequences were used to assess for infiltration and scar tissue. FINDINGS: Difficult study, patient had a hard time holding her  breath so images were not ideal. Limited images of the lung fields showed no gross abnormalities. Small pericardial effusion. Normal left ventricular size with mild LV hypertrophy. Septal-lateral dyssynchrony consistent with LBBB. EF 40%, diffuse hypokinesis. Mildly dilated right ventricle with moderate systolic dysfunction, EF 32%. Severe left atrial enlargement. Moderate right atrial enlargement. Images were too poor to comment on degree of valvular regurgitation though there did appear to be mitral and tricuspid regurgitation. On delayed enhancement imaging, there was no significant late gadolinium enhancement (LGE) noted. Measurements: LVEDV 173 mL LVSV 69 mL LVEF 40% RVEDV 144 mL RVSV 46 mL RVEF 32% IMPRESSION: 1. Normal left ventricular size with mild LV hypertrophy. EF 40%, diffuse hypokinesis with septal-lateral dyssynchrony. 2. Mildly dilated RV with moderate decreased systolic  function, EF 32%. 3.  Biatrial enlargement. 4. No myocardial LGE, so no definitive evidence for prior MI, infiltrative disease, or myocarditis. Difficult study due to respiratory artifact. Dalton Mclean Electronically Signed   By: Marca Anconaalton  Mclean M.D.   On: 03/28/2018 21:12     Medications:     Scheduled Medications: . amLODipine  10 mg Oral Daily  . apixaban  5 mg Oral BID  . aspirin  81 mg Oral Daily  . atorvastatin  80 mg Oral q1800  . clopidogrel  75 mg Oral Q breakfast  . hydrALAZINE  50 mg Oral Q8H  . hydrocortisone cream   Topical BID  . phenytoin  100 mg Oral BID AC  . phenytoin  200 mg Oral Q breakfast  . sacubitril-valsartan  1 tablet Oral BID  . sodium chloride flush  10-40 mL Intracatheter Q12H  . sodium chloride flush  3 mL Intravenous Q12H  . spironolactone  25 mg Oral Daily  . torsemide  40 mg Oral Daily    Infusions: . sodium chloride    . amiodarone 30 mg/hr (03/29/18 0706)    PRN Medications: sodium chloride, acetaminophen, hydrALAZINE, nitroGLYCERIN, ondansetron (ZOFRAN) IV,  polyethylene glycol, sodium chloride flush, sodium chloride flush, traMADol   Assessment:   77 y/o woman admitted 7/26 with acute on chronic systolic HF  Plan/Discussion:    1. 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 Differential also includes ischemic CM, infiltrative CM (amyloid) and CTD-related PAH/HF. Admitted with massive volume overload and NYHA IIIB-IV symptoms - cMRI 03/28/18 with LVEF 40% and no LGE. Full report as above.  [facility]40% 2-3+ MR  - Echo 03/25/18 EF 35%  - Weight down 24 lbs overall  - CVP 6-7 cm.  - Continue torsemide 40 mg daily for now.  - Continue UNNA boots - Continue Entresto 97/103.  - Continue carvedilol 3.125 bid - Continue spiro 25 mg daily - Continue amlodipine 10 mg daily for now.  - Continue hydralazine 25 tid can titrate up as needed 2. Severe HTN - Improving. BP 120-150 range.  - Continue amlodipine. Meds as above.  - PRN IV hydralazine for SBP > 160 3. 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  - Continue ASA, plavix, and Eliquis.  - Will drop ASA after 1 month - Will need plavix for at least 6 months. 4. PAF, new onset - Back into Afib last night.  - Amio gtt rebolused. Converted to NSR but again in Afib this am. Will rebolus and continue amio gtt.  - She is now on Eliquis.  - This patients CHA2DS2-VASc Score and unadjusted Ischemic Stroke Rate (% per year) is equal to 9.7 % stroke rate/year from a score of 6 5. Facial rash - Reviewed with Dermatology. 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 Need titer - Continue Hydrocortisone 2.5% to face and neck - No change to current plan.   6. LBBB - Consider CRT as needed. No change.  7.  OSA - Continue CPAP qhs.  8. Migraines - Continue dilantin. Do not think this is contributing so dose increased with recurrent migraine.  9. Hyponatremia - Na 126 03/28/18. Pending this am.  - Free water restrict.  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  - DDx is broad but includes phenytoin-induced chorea and hyponatremia.  - MRI brain 03/28/18 with acute occipital infarct and chronic microvascular changes.  - Heavy metals and ANA with reflex if positive pending.  - IgG + at > 1800. (Normal [phone] mg/dL) - Will check Chest/Abdomen/Pelvis CT W/WO contrast to r/o paraneoplastic syndrome.    Length of Stay: 5  Luane School  03/29/2018, 7:41 AM  Advanced Heart Failure Team Pager [phone] (M-F; 7a - 4p)  Please contact [facility] for night-coverage after hours (4p -7a ) and weekends on [email or web address]  Patient seen and examined with the above-signed Advanced Practice Provider and/or Housestaff. I personally reviewed laboratory data, imaging studies and relevant notes. I independently examined the patient and formulated the important aspects of the plan. I have edited the note to reflect any of my changes or salient points. I have personally discussed the plan with the patient and/or family.  Remains a bit tenuous. Volume status looks good. CAD is stable after stenting. Had recurrent AF last night an IV amio restarted. Converted to NSR but now back in AF despite IV amio. On Eliquis, ASA and Plavix. MRI brain reviewed with patient and family. Still having periods of choreoform movements. Differential is broad. May be related to hyponatremia +/- anxiety. Possibility of paraneoplastic syndrome raised. Will get CT C/A/P to reassess. Ambulate today.  Arvilla Meres, MD  9:23 AM

## 2018-03-30 LAB — CBC
HEMATOCRIT: 39.4 % (ref 36.0–46.0)
Hemoglobin: 13.6 g/dL (ref 12.0–15.0)
MCH: 33.3 pg (ref 26.0–34.0)
MCHC: 34.5 g/dL (ref 30.0–36.0)
MCV: 96.6 fL (ref 78.0–100.0)
PLATELETS: 211 10*3/uL (ref 150–400)
RBC: 4.08 MIL/uL (ref 3.87–5.11)
RDW: 14.5 % (ref 11.5–15.5)
WBC: 3.2 10*3/uL — ABNORMAL LOW (ref 4.0–10.5)

## 2018-03-30 LAB — BASIC METABOLIC PANEL
Anion gap: 11 (ref 5–15)
BUN: 14 mg/dL (ref 8–23)
CALCIUM: 8.5 mg/dL — AB (ref 8.9–10.3)
CO2: 26 mmol/L (ref 22–32)
CREATININE: 1.14 mg/dL — AB (ref 0.44–1.00)
Chloride: 88 mmol/L — ABNORMAL LOW (ref 98–111)
GFR, EST AFRICAN AMERICAN: 52 mL/min — AB (ref >60)
GFR, EST NON AFRICAN AMERICAN: 45 mL/min — AB (ref >60)
GLUCOSE: 111 mg/dL — AB (ref 70–99)
Potassium: 4 mmol/L (ref 3.5–5.1)
Sodium: 125 mmol/L — ABNORMAL LOW (ref 135–145)

## 2018-03-30 LAB — CULTURE, BLOOD (ROUTINE X 2)
CULTURE: NO GROWTH
CULTURE: NO GROWTH
SPECIAL REQUESTS: ADEQUATE

## 2018-03-30 LAB — COOXEMETRY PANEL
Carboxyhemoglobin: 1 % (ref 0.5–1.5)
METHEMOGLOBIN: 1.4 % (ref 0.0–1.5)
O2 SAT: 72.2 %
TOTAL HEMOGLOBIN: 13.8 g/dL (ref 12.0–16.0)

## 2018-03-30 MED ORDER — VITAMIN B-12 1000 MCG PO TABS
2000.0000 ug | ORAL_TABLET | Freq: Every day | ORAL | Status: DC
  Administered 2018-03-30 – 2018-04-01 (×3): 2000 ug via ORAL
  Filled 2018-03-30 (×3): qty 2

## 2018-03-30 NOTE — Progress Notes (Signed)
Pt refusing to wear CPAP for the night. RT will continue to monitor as needed. 

## 2018-03-30 NOTE — Progress Notes (Signed)
CARDIAC REHAB PHASE I   PRE:  Rate/Rhythm: 95 sr    BP: sitting 114/70    SaO2: 99 ra  MODE:  Ambulation: 300 ft   POST:  Rate/Rhythm: 101 st    BP: sitting 144/62     SaO2: 99 ra  Pt able to stand with mod assist due to weak knees. She wanted to walk without RW however she was unsteady due to weakness. Much steadier and more supported with rollator. She felt good and walked 300 ft with rollator, assist x2 (x1 equipment). Pt is quiet and does not c/o much. To recliner, smiling. Discussed rollator with pt and daughter. She is interested in rollator for home for increased independence. She needs to walk more before d/c.  [phone]   Harriet Massonandi Kristan Townsend Cudworth CES, ACSM 03/30/2018 2:38 PM

## 2018-03-30 NOTE — Progress Notes (Addendum)
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   Pt underwent successful mRCA PCI with DES placement. Will need triple therapy for 4 weeks, and then drop ASA.   Seen by neurology 03/28/18 for intermittent "uncontrolled shaking/tremors". MRI brain as below.   In NSR this am, but again was back in Afib overnight with rates as high as 120-130s. Remains on amio gtt.   Feeling OK this am. Weak but better. No CP. Chroea episodes improved. Since last night, HR has been in 80-90s, even with activity. Denies SOB this am.   CVP 6-7 cm.  EEG 03/29/18 - Normal, awake and asleep.  CT Chest/Abd/Pelvis 03/29/18 1. No findings concerning for malignancy 2. Emphysema 3. Aortic atherosclerosis 4. 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   MR MRA Head 03/29/18 1. Negative posterior circulation aside from tortuous right PCA P1 segment. 2. Distal Right ICA 3-4 mm infundibulum versus aneurysm. Neuro-Interventional Radiology consultation is suggested to evaluate 3. ICA siphon atherosclerosis without stenosis.  MRI brain 03/28/18  1. 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 Atrophy with extensive chronic microvascular ischemia  cMRI 03/28/18 1. Normal LV size with mild LV Hypertrophy. EF 40% with septal-lateral dyssynchrony and diffuse HK. 2. Mildly dilated RV with moderately decreased function, EF 32% 3. BAE 4. No myocardial LGE.  5. Noted to be difficult study due to resp artificat  ECHO EF ~35% by echo EF 40-45% by cath   [facility] 03/27/18  Mid RCA lesion is 90% stenosed.  Prox LAD to Mid LAD lesion is 20% stenosed.  Findings: RA = 5 RV = 58/8 PA = 56/18 (34) PCW = 16 (v = 25) Fick cardiac output/index = 6.8/4.0 PVR =2.7 WU FA sat = 98% PA sat =  77%, 79% SVC sat = 75%  Objective:   Weight Range:  Vital Signs:   Temp:  [97.2 F (36.2 C)-98.4 F (36.9 C)] 97.2 F (36.2 C) (08/01 0723) Pulse Rate:  [81-120] 81 (08/01 0723) Resp:  [10-26] 15 (08/01 0723) BP: (112-153)/(60-78) 139/64 (08/01 0723) SpO2:  [93 %-97 %] 95 % (08/01 0723) Weight:  [131 lb 9.8 oz (59.7 kg)] 131 lb 9.8 oz (59.7 kg) (08/01 0300) Last BM Date: 03/27/18  Weight change: Filed Weights   03/28/18 0646 03/29/18 0500 03/30/18 0300  Weight: 130 lb 15.3 oz (59.4 kg) 128 lb 8 oz (58.3 kg) 131 lb 9.8 oz (59.7 kg)    Intake/Output:   Intake/Output Summary (Last 24 hours) at 03/30/2018 0748 Last data filed at 03/29/2018 1900 Gross per 24 hour  Intake 940 ml  Output 1550 ml  Net -610 ml     Physical Exam   General: Elderly and chronically ill appearing.  HEENT: Normal Neck: Supple. JVP 6-7 cm. Carotids 2+ bilat; no bruits. No thyromegaly or nodule noted. Cor: PMI nondisplaced. RRR, No M/G/R noted Lungs: CTAB, normal effort. Abdomen: Soft, non-tender, non-distended, no HSM. No bruits or masses. +BS  Extremities: No cyanosis, clubbing, or rash. R and LLE no edema.  Neuro: Alert & orientedx3, cranial nerves grossly intact. moves all 4 extremities w/o difficulty. Affect pleasant   Telemetry   NSR this am 80-90s, personally reviewed.   Labs   Basic Metabolic Panel: Recent Labs  Lab 03/24/18 1457 03/25/18 0211 03/25/18 1847 03/26/18 0346 03/27/18  0219 03/28/18 0447  NA 133* 132*  --  129* 127* 126*  K 3.9 4.0 3.6 3.9 4.9 4.8  CL 99 95*  --  92* 92* 95*  CO2 26 27  --  27 22 23   GLUCOSE 121* 104*  --  153* 119* 116*  BUN 12 11  --  10 7* 11  CREATININE 0.72 0.66  --  0.79 0.78 0.80  CALCIUM 8.3* 8.3*  --  8.1* 8.4* 8.2*  MG 1.9  --  1.7  --   --   --     Liver Function Tests: Recent Labs  Lab 03/24/18 1457  AST 40  ALT 21  ALKPHOS 162*  BILITOT 0.8  PROT 7.0  ALBUMIN 3.1*   No results for input(s): LIPASE, AMYLASE in the last  168 hours. No results for input(s): AMMONIA in the last 168 hours.  CBC: Recent Labs  Lab 03/24/18 1457 03/27/18 0702 03/28/18 0447 03/29/18 0237 03/30/18 0445  WBC 2.9* 5.0 4.5 3.9* 3.2*  NEUTROABS 1.7  --   --   --   --   HGB 12.2 13.7 13.2 12.3 13.6  HCT 37.5 40.4 38.9 36.9 39.4  MCV 101.1* 99.0 98.2 98.9 96.6  PLT 174 199 192 183 211    Cardiac Enzymes: Recent Labs  Lab 03/24/18 1457 03/24/18 2031 03/25/18 0211  TROPONINI <0.03 <0.03 <0.03    BNP: BNP (last 3 results) Recent Labs    03/24/18 1457  BNP 2,425.8*    ProBNP (last 3 results) No results for input(s): PROBNP in the last 8760 hours.    Other results:  Imaging: Ct Chest W Contrast  Result Date: 03/29/2018 CLINICAL DATA:  Pulmonary hypertension shortness of breath. Paraneoplastic syndrome. EXAM: CT CHEST, ABDOMEN, AND PELVIS WITH CONTRAST TECHNIQUE: Multidetector CT imaging of the chest, abdomen and pelvis was performed following the standard protocol during bolus administration of intravenous contrast. CONTRAST:  100mL OMNIPAQUE IOHEXOL 300 MG/ML  SOLN COMPARISON:  None. FINDINGS: CT CHEST FINDINGS Cardiovascular: Heart is markedly enlarged. Trace pericardial effusion evident. Coronary artery calcification is evident. Atherosclerotic calcification is noted in the wall of the thoracic aorta. Right PICC line tip is positioned in the distal SVC near the junction with the RA. Mediastinum/Nodes: No mediastinal lymphadenopathy. There is no hilar lymphadenopathy. The esophagus has normal imaging features. There is no axillary lymphadenopathy. Lungs/Pleura: The central tracheobronchial airways are patent. Centrilobular and paraseptal emphysema noted. Atelectasis or scarring noted in the right middle lobe and lingula. Dependent atelectasis evident in the lungs bilaterally. No suspicious pulmonary nodule or mass. No pleural effusion. Musculoskeletal: No worrisome lytic or sclerotic osseous abnormality. CT ABDOMEN  PELVIS FINDINGS Hepatobiliary: No focal abnormality within the liver parenchyma. There is no evidence for gallstones, gallbladder wall thickening, or pericholecystic fluid. No intrahepatic or extrahepatic biliary dilation. Pancreas: No focal mass lesion. No dilatation of the main duct. No intraparenchymal cyst. No peripancreatic edema. Spleen: No splenomegaly. No focal mass lesion. Adrenals/Urinary Tract: No adrenal nodule or mass. Each kidney demonstrates prominent extrarenal pelvis. 4 mm low-density lesion upper pole right kidney is too small to characterize. No hydroureter. The urinary bladder appears normal for the degree of distention. Stomach/Bowel: Stomach is nondistended. No gastric wall thickening. No evidence of outlet obstruction. Duodenum is normally positioned as is the ligament of Treitz. No small bowel wall thickening. No small bowel dilatation. The terminal ileum is normal. The appendix is not visualized, but there is no edema or inflammation in the region of the cecum. No  gross colonic mass. No colonic wall thickening. No substantial diverticular change. Vascular/Lymphatic: There is abdominal aortic atherosclerosis without aneurysm. Portal vein and superior mesenteric vein are patent. There is no gastrohepatic or hepatoduodenal ligament lymphadenopathy. No intraperitoneal or retroperitoneal lymphadenopathy. Small lymph nodes are seen along each pelvic sidewall without lymphadenopathy. Reproductive: Uterus surgically absent.  There is no adnexal mass. Other: No intraperitoneal free fluid. Musculoskeletal: No worrisome lytic or sclerotic osseous abnormality. Advanced degenerative changes are noted in the lower thoracic and lumbar spine sclerotic focus in the L1 vertebral body is likely a bone island. IMPRESSION: 1. No findings in the chest, abdomen, or pelvis to raise concern for malignancy. 2.  Emphysema. (ICD10-J43.9) 3.  Aortic Atherosclerois (ICD10-170.0) 4. Marked degenerative disc disease in the  lower thoracic and lumbar spine. Electronically Signed   By: Kennith Center M.D.   On: 03/29/2018 20:48   Mr Maxine Glenn Head Wo Contrast  Result Date: 03/29/2018 CLINICAL DATA:  77 year old female with punctate lacunar infarct in the left occipital lobe on brain MRI yesterday performed for choreiform movements. Recent coronary artery angiogram. EXAM: MRA HEAD WITHOUT CONTRAST TECHNIQUE: Angiographic images of the Circle of Willis were obtained using MRA technique without intravenous contrast. COMPARISON:  Brain MRI 03/28/2018. FINDINGS: Stable cerebral morphology with no ventriculomegaly or intracranial mass effect. Antegrade flow in the posterior circulation. Incomplete visualization of the distal vertebral arteries today, but the right appears dominant as seen yesterday. Patent right PICA origin. Patent vertebrobasilar junction. Dominant appearing left AICA. Patent basilar artery without stenosis. Normal SCA and left PCA origin. Tortuous right PCA P1 segment without stenosis. Bilateral PCA branches are within normal limits. Antegrade flow in both ICA siphons. Bilateral siphon irregularity in keeping with atherosclerosis but no hemodynamically significant siphon stenosis identified. However, there is a laterally directed 3-4 millimeter saccular lesion from the right supraclinoid ICA. This is somewhat more laterally directed than typical for a posterior communicating artery or anterior choroidal artery origin. Normal ophthalmic artery origins and left ICA terminus. Normal MCA and ACA origins. Diminutive or absent anterior communicating artery. Visible bilateral ACA branches are within normal limits. MCA M1 segments, bilateral MCA bi/trifurcations, and visible bilateral MCA branches are within normal limits. IMPRESSION: 1. Negative posterior circulation aside from tortuous right PCA P1 segment. 2. Distal Right ICA 3-4 mm infundibulum versus aneurysm. Neuro-Interventional Radiology consultation is suggested to evaluate  the appropriateness of potential treatment. Non-emergent evaluation can be arranged by calling [phone] during usual hours. Emergency evaluation can be requested by paging [phone]. 3. ICA siphon atherosclerosis without stenosis. Electronically Signed   By: Odessa Fleming M.D.   On: 03/29/2018 14:58   Mr Brain Wo Contrast  Result Date: 03/28/2018 CLINICAL DATA:  Chorea movements EXAM: MRI HEAD WITHOUT CONTRAST TECHNIQUE: Multiplanar, multiecho pulse sequences of the brain and surrounding structures were obtained without intravenous contrast. COMPARISON:  None. FINDINGS: Brain: Small 5 mm area of acute infarct in the left lateral occipital lobe. No other acute infarct. Mild atrophy. Extensive chronic microvascular ischemic changes in the white matter and pons. Negative for hemorrhage or mass. No fluid collection or midline shift. Vascular: Normal arterial flow voids Skull and upper cervical spine: Negative Sinuses/Orbits: Mucosal edema in the paranasal sinuses. This is most prominent in the right sphenoid sinus. Bilateral cataract removal. 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 Electronically Signed   By: Marlan Palau M.D.   On: 03/28/2018 15:12   Ct Abdomen Pelvis W Contrast  Result  Date: 03/29/2018 CLINICAL DATA:  Pulmonary hypertension shortness of breath. Paraneoplastic syndrome. EXAM: CT CHEST, ABDOMEN, AND PELVIS WITH CONTRAST TECHNIQUE: Multidetector CT imaging of the chest, abdomen and pelvis was performed following the standard protocol during bolus administration of intravenous contrast. CONTRAST:  OMNIPAQUE IOHEXOL 300 MG/ML  SOLN COMPARISON:  None. FINDINGS: CT CHEST FINDINGS Cardiovascular: Heart is markedly enlarged. Trace pericardial effusion evident. Coronary artery calcification is evident. Atherosclerotic calcification is noted in the wall of the thoracic aorta. Right PICC line tip is positioned  in the distal SVC near the junction with the RA. Mediastinum/Nodes: No mediastinal lymphadenopathy. There is no hilar lymphadenopathy. The esophagus has normal imaging features. There is no axillary lymphadenopathy. Lungs/Pleura: The central tracheobronchial airways are patent. Centrilobular and paraseptal emphysema noted. Atelectasis or scarring noted in the right middle lobe and lingula. Dependent atelectasis evident in the lungs bilaterally. No suspicious pulmonary nodule or mass. No pleural effusion. Musculoskeletal: No worrisome lytic or sclerotic osseous abnormality. CT ABDOMEN PELVIS FINDINGS Hepatobiliary: No focal abnormality within the liver parenchyma. There is no evidence for gallstones, gallbladder wall thickening, or pericholecystic fluid. No intrahepatic or extrahepatic biliary dilation. Pancreas: No focal mass lesion. No dilatation of the main duct. No intraparenchymal cyst. No peripancreatic edema. Spleen: No splenomegaly. No focal mass lesion. Adrenals/Urinary Tract: No adrenal nodule or mass. Each kidney demonstrates prominent extrarenal pelvis. 4 mm low-density lesion upper pole right kidney is too small to characterize. No hydroureter. The urinary bladder appears normal for the degree of distention. Stomach/Bowel: Stomach is nondistended. No gastric wall thickening. No evidence of outlet obstruction. Duodenum is normally positioned as is the ligament of Treitz. No small bowel wall thickening. No small bowel dilatation. The terminal ileum is normal. The appendix is not visualized, but there is no edema or inflammation in the region of the cecum. No gross colonic mass. No colonic wall thickening. No substantial diverticular change. Vascular/Lymphatic: There is abdominal aortic atherosclerosis without aneurysm. Portal vein and superior mesenteric vein are patent. There is no gastrohepatic or hepatoduodenal ligament lymphadenopathy. No intraperitoneal or retroperitoneal lymphadenopathy. Small lymph  nodes are seen along each pelvic sidewall without lymphadenopathy. Reproductive: Uterus surgically absent.  There is no adnexal mass. Other: No intraperitoneal free fluid. Musculoskeletal: No worrisome lytic or sclerotic osseous abnormality. Advanced degenerative changes are noted in the lower thoracic and lumbar spine sclerotic focus in the L1 vertebral body is likely a bone island. IMPRESSION: 1. No findings in the chest, abdomen, or pelvis to raise concern for malignancy. 2.  Emphysema. (ICD10-J43.9) 3.  Aortic Atherosclerois (ICD10-170.0) 4. 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 Electronically Signed   By: Kennith Center M.D.   On: 03/29/2018 20:48   Mr Cardiac Morphology W Wo Contrast  Result Date: 03/28/2018 CLINICAL DATA:  Cardiomyopathy of uncertain etiology. EXAM: CARDIAC MRI TECHNIQUE: The patient was scanned on a 1.5 Tesla GE magnet. A dedicated cardiac coil was used. Functional imaging was done using Fiesta sequences. 2,3, and 4 chamber views were done to assess for RWMA's. Modified Simpson's rule using a short axis stack was used to calculate an ejection fraction on a dedicated work [employer]. The patient received 28 cc of Multihance. After 10 minutes inversion recovery sequences were used to assess for infiltration and scar tissue. FINDINGS: Difficult study, patient had a hard time holding her breath so images were not ideal. Limited images of the lung fields showed no gross abnormalities. Small pericardial effusion. Normal left ventricular size with  mild LV hypertrophy. Septal-lateral dyssynchrony consistent with LBBB. EF 40%, diffuse hypokinesis. Mildly dilated right ventricle with moderate systolic dysfunction, EF 32%. Severe left atrial enlargement. Moderate right atrial enlargement. Images were too poor to comment on degree of valvular regurgitation though there did appear to be mitral and tricuspid regurgitation. On delayed enhancement  imaging, there was no significant late gadolinium enhancement (LGE) noted. Measurements: LVEDV 173 mL LVSV 69 mL LVEF 40% RVEDV 144 mL RVSV 46 mL RVEF 32% IMPRESSION: 1. Normal left ventricular size with mild LV hypertrophy. EF 40%, diffuse hypokinesis with septal-lateral dyssynchrony. 2. Mildly dilated RV with moderate decreased systolic function, EF 32%. 3.  Biatrial enlargement. 4. No myocardial LGE, so no definitive evidence for prior MI, infiltrative disease, or myocarditis. Difficult study due to respiratory artifact. Dalton Mclean Electronically Signed   By: Marca Ancona M.D.   On: 03/28/2018 21:12     Medications:     Scheduled Medications: . amLODipine  10 mg Oral Daily  . apixaban  5 mg Oral BID  . aspirin  81 mg Oral Daily  . atorvastatin  80 mg Oral q1800  . clopidogrel  75 mg Oral Q breakfast  . hydrALAZINE  50 mg Oral Q8H  . hydrocortisone cream   Topical BID  . phenytoin  100 mg Oral BID AC  . phenytoin  200 mg Oral Q breakfast  . sacubitril-valsartan  1 tablet Oral BID  . sodium chloride flush  10-40 mL Intracatheter Q12H  . sodium chloride flush  3 mL Intravenous Q12H  . spironolactone  25 mg Oral Daily  . torsemide  40 mg Oral Daily    Infusions: . sodium chloride    . amiodarone 30 mg/hr (03/30/18 0113)    PRN Medications: sodium chloride, acetaminophen, hydrALAZINE, nitroGLYCERIN, ondansetron (ZOFRAN) IV, polyethylene glycol, sodium chloride flush, sodium chloride flush, traMADol   Assessment:   77 y/o woman admitted 7/26 with acute on chronic systolic HF  Plan/Discussion:    1. 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 Differential also includes ischemic CM, infiltrative CM (amyloid) and CTD-related PAH/HF. Admitted with massive volume overload and NYHA IIIB-IV symptoms - cMRI 03/28/18 with LVEF 40% and  no LGE. Full report as above.  - Echo [facility] 5/19 EF ~40% 2-3+ MR  - Echo 03/25/18 EF 35%  - Weight down > 20 lbs.  - Volume status stable. CVP 6-7 cm - Continue torsemide 40 mg daily for now.  - Continue UNNA boots - Continue Entresto 97/103.  - Continue carvedilol 3.125 bid - Continue spiro 25 mg daily - Continue amlodipine 10 mg daily for now.  - Continue hydralazine 25 tid can titrate up as needed - Reinforced fluid restriction to < 2 L daily, sodium restriction to less than 2000 mg daily, and the importance of daily weights.   2. Severe HTN - Improved. SBP ranges 110-150. SBP 120s this am. - Continue amlodipine. Meds as above.  - PRN IV hydralazine for SBP > 160 3. 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  - Continue ASA, plavix, and Eliquis.  - Will drop ASA after 1 month - Will need plavix for at least 6 months. - No change to current plan.   4. PAF, new onset -  Continues to go in and out of Afib. NSR this am.  - Continue Amio gtt. May need to consider Tikosyn in the future. - She is now on Eliquis.  - This patients CHA2DS2-VASc Score and unadjusted Ischemic Stroke Rate (% per year) is equal to 9.7 % stroke rate/year from a score of 6 5. Facial rash - Reviewed with Dermatology. 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 + Ro and La antibodies as well as below.  - Continue Hydrocortisone 2.5% to face and neck 6. LBBB - Consider CRT as needed. No change.  7. OSA - Continue CPAP qhs.  8. Migraines - Continue dilantin. Do not think this is contributing so dose increased with recurrent migraine.  - Stable.  9. Hyponatremia - Na pending.  - Free water restrict.  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  - DDx is broad but includes phenytoin-induced chorea and hyponatremia.  - MRI brain 03/28/18 with acute occipital infarct and  chronic microvascular changes.  - Heavy metals negative - IgG + at > 1800. (Normal [phone] mg/dL) - CT Chest/Abdomen/Pelvis 03/29/18 with no evidence for malignancy.  11. Rheumatological - ANA +, + SSA (Ro) and SSB (La) Antibodies, IgG. - Will need follow up with Rheumatology on discharge.    Length of Stay: [address]  Luane School  03/30/2018, 7:48 AM  Advanced Heart Failure Team Pager [phone] (M-F; 7a - 4p)  Please contact [facility] for night-coverage after hours (4p -7a ) and weekends on [email or web address]  Patient seen and examined with the above-signed Advanced Practice Provider and/or Housestaff. I personally reviewed laboratory data, imaging studies and relevant notes. I independently examined the patient and formulated the important aspects of the plan. I have edited the note to reflect any of my changes or salient points. I have personally discussed the plan with the patient and/or family.  From HF standpoint she is doing much better. Volume status optimized. BP better controlled. Still several other issues active including PAF and neuro w/u. Had recurrent AF with RVR last night but back in NSR this am. Will continue IV amio one more day. On apixaban without bleeding. Phenytoin is inducer of CYP 3a/4 system so can reduce apixaban levels but given her age. Low eight andneed for triple therapy it is probably an acceptable way to proceed. Discussed dosing with PharmD personally. 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 Recommend repeat CTA in 6 months. Chore may be related to anxiety vs lupus. Suspect lupus is cutaneous form but will need Rheum f/u. PT/OT to see today.  Arvilla Meres, MD  9:46 AM

## 2018-03-30 NOTE — Progress Notes (Signed)
Subjective: Completed carotid ultrasound and MRA head.   Objective: Current vital signs: BP 139/64 (BP Location: Right Arm)   Pulse 81   Temp (!) 97.2 F (36.2 C) (Oral)   Resp 15   Ht _0  (1.676 m)   Wt 59.7 kg (131 lb 9.8 oz)   SpO2 95%   BMI 21.24 kg/m  Vital signs in last 24 hours: Temp:  [97.2 F (36.2 C)-98.4 F (36.9 C)] 97.2 F (36.2 C) (08/01 0723) Pulse Rate:  [81-123] 81 (08/01 0723) Resp:  [10-26] 15 (08/01 0723) BP: (112-153)/(60-78) 139/64 (08/01 0723) SpO2:  [93 %-97 %] 95 % (08/01 0723) Weight:  [59.7 kg (131 lb 9.8 oz)] 59.7 kg (131 lb 9.8 oz) (08/01 0300)  Intake/Output from previous day: 07/31 0701 - 08/01 0700 In: 940 [P.O.:940] Out: 1950 [Urine:1950] Intake/Output this shift: No intake/output data recorded. Nutritional status:  Diet Order           Diet Heart Room service appropriate? Yes; Fluid consistency: Thin  Diet effective now          Neurologic Exam: Ment: Intact to complex questions and commands.  CN: EOMI, face symmetric. No dysarthria or hypophonia.  Motor: No shivering, chorea or other adventitious movements seen.   Lab Results: Results for orders placed or performed during the hospital encounter of 03/24/18 (from the past 48 hour(s))  ANA w/Reflex if Positive     Status: Abnormal   Collection Time: 03/28/18 11:25 AM  Result Value Ref Range   Anti Nuclear Antibody(ANA) Positive (A) Negative    Comment: (NOTE) Performed At: [facility] [place], [place] [id number] Rush Farmer MD [id number]   ENA+DNA/DS+ANTICH+CENTRO+JO...     Status: Abnormal   Collection Time: 03/28/18 11:25 AM  Result Value Ref Range   ds DNA Ab <1 0 - 9 IU/mL    Comment: (NOTE)                                   Negative      <5                                   Equivocal  5 - 9                                   Positive      >9    Ribonucleic Protein <0.2 0.0 - 0.9 AI   ENA SM Ab Ser-aCnc <0.2 0.0 - 0.9 AI    Scleroderma (Scl-70) (ENA) Antibody, IgG <0.2 0.0 - 0.9 AI   SSA (Ro) (ENA) Antibody, IgG >8.0 (H) 0.0 - 0.9 AI   SSB (La) (ENA) Antibody, IgG >8.0 (H) 0.0 - 0.9 AI   Chromatin Ab SerPl-aCnc 0.6 0.0 - 0.9 AI   Anti JO-1 <0.2 0.0 - 0.9 AI   Centromere Ab Screen <0.2 0.0 - 0.9 AI   See below: Comment     Comment: (NOTE) Autoantibody                       Disease Association ------------------------------------------------------------                        Condition  Frequency ---------------------   ------------------------   --------- Antinuclear Antibody,    SLE, mixed connective Direct (ANA-D)           tissue diseases ---------------------   ------------------------   --------- dsDNA                    SLE                        40 - 60% ---------------------   ------------------------   --------- Chromatin                Drug induced SLE                90%                         SLE                        48 - 97% ---------------------   ------------------------   --------- SSA (Ro)                 SLE                        25 - 35%                         Sjogren's Syndrome         40 - 70%                         Neonatal Lupus                 100% ---------------------   ------------------------   --------- SSB (La)                 SLE                              10%                         Sjogren's Syndrome              30% ---------------------   -----------------------    --------- Sm (anti-Smith)          SLE                        15 - 30% ---------------------   -----------------------    --------- RNP                      Mixed Connective Tissue                         Disease                         95% (U1 nRNP,                SLE                        30 - 50% anti-ribonucleoprotein)  Polymyositis and/or  Dermatomyositis                 20% ---------------------   ------------------------   --------- Scl-70 (antiDNA           Scleroderma (diffuse)      20 - 35% topoisomerase)           Crest                           13% ---------------------   ------------------------   --------- Jo-1                     Polymyositis and/or                         Dermatomyositis            20 - 40% ---------------------   ------------------------   --------- Centromere B             Scleroderma -  Crest                         variant                         80% Performed At: [facility] [place], [place] [id number] Rush Farmer MD [id number]   Heavy metals, blood     Status: None   Collection Time: 03/28/18  4:35 PM  Result Value Ref Range   Arsenic 4 2 - 23 ug/L    Comment: (NOTE) This test was developed and its performance characteristics determined by [facility]. It has not been cleared or approved by the Food and Drug Administration.                                Detection Limit = 1    Mercury None Detected 0.0 - 14.9 ug/L    Comment: (NOTE) This test was developed and its performance characteristics determined by [facility]. It has not been cleared or approved by the Food and Drug Administration.                        Environmental Exposure:  <15.0                        Occupational Exposure:                         BEI - Inorganic Mercury: 15.0                                Detection Limit =  1.0 Performed At: [facility] [place], [place] [id number] Rush Farmer MD [id number]    Lead 1 0 - 4 ug/dL    Comment: (NOTE) Testing performed by Induct[employer] coupled Estate manager/land agent.                          Environmental Exposure:                           WHO Recommendation    <20  Occupational Exposure:                           OSHA Lead Std          40                           BEI                    30                                Detection Limit =  1 This test was developed and its performance characteristics  determined by [facility]. It has not been cleared or approved by the Food and Drug Administration.   IgG     Status: Abnormal   Collection Time: 03/28/18  4:35 PM  Result Value Ref Range   IgG (Immunoglobin G), Serum 1,800 (H) 700 - 1,600 mg/dL    Comment: (NOTE) Performed At: [facility] [place], [place] [id number] Rush Farmer MD [id number]   .Cooxemetry Panel (carboxy, met, total hgb, O2 sat)     Status: None   Collection Time: 03/29/18  2:25 AM  Result Value Ref Range   Total hemoglobin 12.7 12.0 - 16.0 g/dL   O2 Saturation 70.6 %   Carboxyhemoglobin 1.0 0.5 - 1.5 %   Methemoglobin 1.4 0.0 - 1.5 %  CBC     Status: Abnormal   Collection Time: 03/29/18  2:37 AM  Result Value Ref Range   WBC 3.9 (L) 4.0 - 10.5 K/uL   RBC 3.73 (L) 3.87 - 5.11 MIL/uL   Hemoglobin 12.3 12.0 - 15.0 g/dL   HCT 36.9 36.0 - 46.0 %   MCV 98.9 78.0 - 100.0 fL   MCH 33.0 26.0 - 34.0 pg   MCHC 33.3 30.0 - 36.0 g/dL   RDW 15.1 11.5 - 15.5 %   Platelets 183 150 - 400 K/uL    Comment: Performed at [facility], [place] [address], [place], [place] [address]  Vitamin B12     Status: None   Collection Time: 03/29/18  8:00 AM  Result Value Ref Range   Vitamin B-12 287 180 - 914 pg/mL    Comment: (NOTE) This assay is not validated for testing neonatal or myeloproliferative syndrome specimens for Vitamin B12 levels. Performed at [facility], [place] [address], [place], [place] [address]   .Cooxemetry Panel (carboxy, met, total hgb, O2 sat)     Status: None   Collection Time: 03/29/18  8:45 AM  Result Value Ref Range   Total hemoglobin 12.8 12.0 - 16.0 g/dL   O2 Saturation 60.6 %   Carboxyhemoglobin 1.3 0.5 - 1.5 %   Methemoglobin 0.9 0.0 - 1.5 %  .Cooxemetry Panel (carboxy, met, total hgb, O2 sat)     Status: None   Collection Time: 03/30/18  4:35 AM  Result Value Ref Range   Total hemoglobin 13.8 12.0 - 16.0 g/dL   O2 Saturation 72.2 %   Carboxyhemoglobin 1.0 0.5 -  1.5 %   Methemoglobin 1.4 0.0 - 1.5 %  CBC     Status: Abnormal   Collection Time: 03/30/18  4:45 AM  Result Value Ref Range   WBC 3.2 (L) 4.0 - 10.5 K/uL   RBC 4.08 3.87 - 5.11 MIL/uL  Hemoglobin 13.6 12.0 - 15.0 g/dL   HCT 39.4 36.0 - 46.0 %   MCV 96.6 78.0 - 100.0 fL   MCH 33.3 26.0 - 34.0 pg   MCHC 34.5 30.0 - 36.0 g/dL   RDW 14.5 11.5 - 15.5 %   Platelets 211 150 - 400 K/uL    Comment: Performed at [facility], [place] [address], [place], [place] [address]    Recent Results (from the past 240 hour(s))  MRSA PCR Screening     Status: None   Collection Time: 03/24/18  2:40 PM  Result Value Ref Range Status   MRSA by PCR NEGATIVE NEGATIVE Final    Comment:        The GeneXpert MRSA Assay (FDA approved for NASAL specimens only), is one component of a comprehensive MRSA colonization surveillance program. It is not intended to diagnose MRSA infection nor to guide or monitor treatment for MRSA infections. Performed at [facility], [place] [address], [place], [place] [address]   Culture, blood (Routine X 2) w Reflex to ID Panel     Status: None (Preliminary result)   Collection Time: 03/25/18  8:50 PM  Result Value Ref Range Status   Specimen Description BLOOD LEFT ANTECUBITAL  Final   Special Requests   Final    BOTTLES DRAWN AEROBIC AND ANAEROBIC Blood Culture adequate volume   Culture   Final    NO GROWTH 4 DAYS Performed at [facility], [place] [address], [place], [place] [address]    Report Status PENDING  Incomplete  Culture, blood (Routine X 2) w Reflex to ID Panel     Status: None (Preliminary result)   Collection Time: 03/25/18  8:52 PM  Result Value Ref Range Status   Specimen Description BLOOD LEFT ARM  Final   Special Requests   Final    BOTTLES DRAWN AEROBIC ONLY Blood Culture results may not be optimal due to an inadequate volume of blood received in culture bottles   Culture   Final    NO GROWTH 4 DAYS Performed at [facility],  [place] [address] [place], [place] [address]    Report Status PENDING  Incomplete    Lipid Panel No results for input(s): CHOL, TRIG, HDL, CHOLHDL, VLDL, LDLCALC in the last 72 hours.  Studies/Results: Ct Chest W Contrast  Result Date: 03/29/2018 CLINICAL DATA:  Pulmonary hypertension shortness of breath. Paraneoplastic syndrome. EXAM: CT CHEST, ABDOMEN, AND PELVIS WITH CONTRAST TECHNIQUE: Multidetector CT imaging of the chest, abdomen and pelvis was performed following the standard protocol during bolus administration of intravenous contrast. CONTRAST:  123m OMNIPAQUE IOHEXOL 300 MG/ML  SOLN COMPARISON:  None. FINDINGS: CT CHEST FINDINGS Cardiovascular: Heart is markedly enlarged. Trace pericardial effusion evident. Coronary artery calcification is evident. Atherosclerotic calcification is noted in the wall of the thoracic aorta. Right PICC line tip is positioned in the distal SVC near the junction with the RA. Mediastinum/Nodes: No mediastinal lymphadenopathy. There is no hilar lymphadenopathy. The esophagus has normal imaging features. There is no axillary lymphadenopathy. Lungs/Pleura: The central tracheobronchial airways are patent. Centrilobular and paraseptal emphysema noted. Atelectasis or scarring noted in the right middle lobe and lingula. Dependent atelectasis evident in the lungs bilaterally. No suspicious pulmonary nodule or mass. No pleural effusion. Musculoskeletal: No worrisome lytic or sclerotic osseous abnormality. CT ABDOMEN PELVIS FINDINGS Hepatobiliary: No focal abnormality within the liver parenchyma. There is no evidence for gallstones, gallbladder wall thickening, or pericholecystic fluid. No intrahepatic or  extrahepatic biliary dilation. Pancreas: No focal mass lesion. No dilatation of the main duct. No intraparenchymal cyst. No peripancreatic edema. Spleen: No splenomegaly. No focal mass lesion. Adrenals/Urinary Tract: No adrenal nodule or mass. Each kidney demonstrates prominent  extrarenal pelvis. 4 mm low-density lesion upper pole right kidney is too small to characterize. No hydroureter. The urinary bladder appears normal for the degree of distention. Stomach/Bowel: Stomach is nondistended. No gastric wall thickening. No evidence of outlet obstruction. Duodenum is normally positioned as is the ligament of Treitz. No small bowel wall thickening. No small bowel dilatation. The terminal ileum is normal. The appendix is not visualized, but there is no edema or inflammation in the region of the cecum. No gross colonic mass. No colonic wall thickening. No substantial diverticular change. Vascular/Lymphatic: There is abdominal aortic atherosclerosis without aneurysm. Portal vein and superior mesenteric vein are patent. There is no gastrohepatic or hepatoduodenal ligament lymphadenopathy. No intraperitoneal or retroperitoneal lymphadenopathy. Small lymph nodes are seen along each pelvic sidewall without lymphadenopathy. Reproductive: Uterus surgically absent.  There is no adnexal mass. Other: No intraperitoneal free fluid. Musculoskeletal: No worrisome lytic or sclerotic osseous abnormality. Advanced degenerative changes are noted in the lower thoracic and lumbar spine sclerotic focus in the L1 vertebral body is likely a bone island. IMPRESSION: 1. No findings in the chest, abdomen, or pelvis to raise concern for malignancy. 2.  Emphysema. (ICD10-J43.9) 3.  Aortic Atherosclerois (ICD10-170.0) 4. 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 Electronically Signed   By: Misty Stanley M.D.   On: 03/29/2018 20:48   Mr Jodene Nam Head Wo Contrast  Result Date: 03/29/2018 CLINICAL DATA:  77 year old female with punctate lacunar infarct in the left occipital lobe on brain MRI yesterday performed for choreiform movements. Recent coronary artery angiogram. EXAM: MRA HEAD WITHOUT CONTRAST TECHNIQUE: Angiographic images of the Circle of Willis were obtained using MRA technique  without intravenous contrast. COMPARISON:  Brain MRI 03/28/2018. FINDINGS: Stable cerebral morphology with no ventriculomegaly or intracranial mass effect. Antegrade flow in the posterior circulation. Incomplete visualization of the distal vertebral arteries today, but the right appears dominant as seen yesterday. Patent right PICA origin. Patent vertebrobasilar junction. Dominant appearing left AICA. Patent basilar artery without stenosis. Normal SCA and left PCA origin. Tortuous right PCA P1 segment without stenosis. Bilateral PCA branches are within normal limits. Antegrade flow in both ICA siphons. Bilateral siphon irregularity in keeping with atherosclerosis but no hemodynamically significant siphon stenosis identified. However, there is a laterally directed 3-4 millimeter saccular lesion from the right supraclinoid ICA. This is somewhat more laterally directed than typical for a posterior communicating artery or anterior choroidal artery origin. Normal ophthalmic artery origins and left ICA terminus. Normal MCA and ACA origins. Diminutive or absent anterior communicating artery. Visible bilateral ACA branches are within normal limits. MCA M1 segments, bilateral MCA bi/trifurcations, and visible bilateral MCA branches are within normal limits. IMPRESSION: 1. Negative posterior circulation aside from tortuous right PCA P1 segment. 2. Distal Right ICA 3-4 mm infundibulum versus aneurysm. Neuro-Interventional Radiology consultation is suggested to evaluate the appropriateness of potential treatment. Non-emergent evaluation can be arranged by calling [phone] during usual hours. Emergency evaluation can be requested by paging [phone]. 3. ICA siphon atherosclerosis without stenosis. Electronically Signed   By: Genevie Ann M.D.   On: 03/29/2018 14:58   Mr Brain Wo Contrast  Result Date: 03/28/2018 CLINICAL DATA:  Chorea movements EXAM: MRI HEAD WITHOUT CONTRAST TECHNIQUE: Multiplanar, multiecho pulse  sequences of the brain and  surrounding structures were obtained without intravenous contrast. COMPARISON:  None. FINDINGS: Brain: Small 5 mm area of acute infarct in the left lateral occipital lobe. No other acute infarct. Mild atrophy. Extensive chronic microvascular ischemic changes in the white matter and pons. Negative for hemorrhage or mass. No fluid collection or midline shift. Vascular: Normal arterial flow voids Skull and upper cervical spine: Negative Sinuses/Orbits: Mucosal edema in the paranasal sinuses. This is most prominent in the right sphenoid sinus. Bilateral cataract removal. 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 Electronically Signed   By: Franchot Gallo M.D.   On: 03/28/2018 15:12   Ct Abdomen Pelvis W Contrast  Result Date: 03/29/2018 CLINICAL DATA:  Pulmonary hypertension shortness of breath. Paraneoplastic syndrome. EXAM: CT CHEST, ABDOMEN, AND PELVIS WITH CONTRAST TECHNIQUE: Multidetector CT imaging of the chest, abdomen and pelvis was performed following the standard protocol during bolus administration of intravenous contrast. CONTRAST:  142m OMNIPAQUE IOHEXOL 300 MG/ML  SOLN COMPARISON:  None. FINDINGS: CT CHEST FINDINGS Cardiovascular: Heart is markedly enlarged. Trace pericardial effusion evident. Coronary artery calcification is evident. Atherosclerotic calcification is noted in the wall of the thoracic aorta. Right PICC line tip is positioned in the distal SVC near the junction with the RA. Mediastinum/Nodes: No mediastinal lymphadenopathy. There is no hilar lymphadenopathy. The esophagus has normal imaging features. There is no axillary lymphadenopathy. Lungs/Pleura: The central tracheobronchial airways are patent. Centrilobular and paraseptal emphysema noted. Atelectasis or scarring noted in the right middle lobe and lingula. Dependent atelectasis evident in the lungs bilaterally. No  suspicious pulmonary nodule or mass. No pleural effusion. Musculoskeletal: No worrisome lytic or sclerotic osseous abnormality. CT ABDOMEN PELVIS FINDINGS Hepatobiliary: No focal abnormality within the liver parenchyma. There is no evidence for gallstones, gallbladder wall thickening, or pericholecystic fluid. No intrahepatic or extrahepatic biliary dilation. Pancreas: No focal mass lesion. No dilatation of the main duct. No intraparenchymal cyst. No peripancreatic edema. Spleen: No splenomegaly. No focal mass lesion. Adrenals/Urinary Tract: No adrenal nodule or mass. Each kidney demonstrates prominent extrarenal pelvis. 4 mm low-density lesion upper pole right kidney is too small to characterize. No hydroureter. The urinary bladder appears normal for the degree of distention. Stomach/Bowel: Stomach is nondistended. No gastric wall thickening. No evidence of outlet obstruction. Duodenum is normally positioned as is the ligament of Treitz. No small bowel wall thickening. No small bowel dilatation. The terminal ileum is normal. The appendix is not visualized, but there is no edema or inflammation in the region of the cecum. No gross colonic mass. No colonic wall thickening. No substantial diverticular change. Vascular/Lymphatic: There is abdominal aortic atherosclerosis without aneurysm. Portal vein and superior mesenteric vein are patent. There is no gastrohepatic or hepatoduodenal ligament lymphadenopathy. No intraperitoneal or retroperitoneal lymphadenopathy. Small lymph nodes are seen along each pelvic sidewall without lymphadenopathy. Reproductive: Uterus surgically absent.  There is no adnexal mass. Other: No intraperitoneal free fluid. Musculoskeletal: No worrisome lytic or sclerotic osseous abnormality. Advanced degenerative changes are noted in the lower thoracic and lumbar spine sclerotic focus in the L1 vertebral body is likely a bone island. IMPRESSION: 1. No findings in the chest, abdomen, or pelvis to  raise concern for malignancy. 2.  Emphysema. (ICD10-J43.9) 3.  Aortic Atherosclerois (ICD10-170.0) 4. 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 Electronically Signed   By: EMisty StanleyM.D.   On: 03/29/2018 20:48   Mr Cardiac Morphology W Wo Contrast  Result Date: 03/28/2018 CLINICAL DATA:  Cardiomyopathy of uncertain etiology. EXAM: CARDIAC MRI TECHNIQUE: The patient was scanned on a 1.5 Tesla GE magnet. A dedicated cardiac coil was used. Functional imaging was done using Fiesta sequences. 2,3, and 4 chamber views were done to assess for RWMA's. Modified Simpson's rule using a short axis stack was used to calculate an ejection fraction on a dedicated work Conservation officer, nature. The patient received 28 cc of Multihance. After 10 minutes inversion recovery sequences were used to assess for infiltration and scar tissue. FINDINGS: Difficult study, patient had a hard time holding her breath so images were not ideal. Limited images of the lung fields showed no gross abnormalities. Small pericardial effusion. Normal left ventricular size with mild LV hypertrophy. Septal-lateral dyssynchrony consistent with LBBB. EF 40%, diffuse hypokinesis. Mildly dilated right ventricle with moderate systolic dysfunction, EF 03%. Severe left atrial enlargement. Moderate right atrial enlargement. Images were too poor to comment on degree of valvular regurgitation though there did appear to be mitral and tricuspid regurgitation. On delayed enhancement imaging, there was no significant late gadolinium enhancement (LGE) noted. Measurements: LVEDV 173 mL LVSV 69 mL LVEF 40% RVEDV 144 mL RVSV 46 mL RVEF 32% IMPRESSION: 1. Normal left ventricular size with mild LV hypertrophy. EF 40%, diffuse hypokinesis with septal-lateral dyssynchrony. 2. Mildly dilated RV with moderate decreased systolic function, EF 21%. 3.  Biatrial enlargement. 4. No myocardial LGE, so no definitive evidence for prior MI,  infiltrative disease, or myocarditis. Difficult study due to respiratory artifact. Dalton Mclean Electronically Signed   By: Loralie Champagne M.D.   On: 03/28/2018 21:12    Medications:  Scheduled: . amLODipine  10 mg Oral Daily  . apixaban  5 mg Oral BID  . aspirin  81 mg Oral Daily  . atorvastatin  80 mg Oral q1800  . clopidogrel  75 mg Oral Q breakfast  . hydrALAZINE  50 mg Oral Q8H  . hydrocortisone cream   Topical BID  . phenytoin  100 mg Oral BID AC  . phenytoin  200 mg Oral Q breakfast  . sacubitril-valsartan  1 tablet Oral BID  . sodium chloride flush  10-40 mL Intracatheter Q12H  . sodium chloride flush  3 mL Intravenous Q12H  . spironolactone  25 mg Oral Daily  . torsemide  40 mg Oral Daily   Continuous: . sodium chloride    . amiodarone 30 mg/hr (03/30/18 0113)    EEG: Normal electroencephalogram, awake and asleep. There are no focal lateralizing or epileptiform features. Episodes of head movement during the reocording were without epileptiform correlate.    Carotid ultrasound: Right Carotid: Velocities in the right ICA are consistent with a 1-39% stenosis. Left Carotid: Velocities in the left ICA are consistent with a 1-39% stenosis. Vertebrals: Bilateral vertebral arteries demonstrate antegrade flow. Subclavians: Normal flow hemodynamics were seen in bilateral subclavian arteries.  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 1. 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 Lead toxicity is off the DDx given negative heavy metal screen.Manganese toxicity is epidemiologically quite unlikely for this patient; manganese testing not  available in laboratory test ordering system at [facility].  2.Also on the DDx is phenytoin-induced chorea, which is an uncommon side effect found in  case reports in the literature.Noothermedications with chorea as a side effect, such asestrogen, methotrexate, prochlorperazine, metoclopramideorother dopamine-blocking agents were found on her inpatient or home medications lists. 3. MRI brain showed no findings to explain her chorea. 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 DDx for etiology includes cardioembolic given atrial fibrillation and CHF with low EF, artery to artery embolization, in situ thrombosis and small vessel disease. She is already on atorvastatin, Plavix, ASA and apixaban per Cardiology orders, which would constitute maximal medical therapy for stroke prevention. Imaging of the vessels of the head and neck is indicated to assess for possible critical stenosis.  4. Of note, son at the bedside today feels that his mother's choreiform movements and shivering spells are most likely stress-related.  5. EEG was normal. There are no focal lateralizing or epileptiform features. Episodes of head movement during the reocording were without epileptiform correlate.   6. MRA head showed tortuous right PCA P1 segment which is incidental, ICA siphon atherosclerosis without stenosis, and distal Right ICA 3-4 mm infundibulum versus aneurysm. The latter finding given its size has a low likelihood of rupture. Given her age, the risks of further assessment with 4-vessel angiogram most likely outweigh potential benefits. Discussed with patient and family, who expressed understanding and agreement with the plan. 7. Carotid ultrasound showed mild atherosclerotic narrowing at the ICA origins bilaterally.  8. ANA positive. SSA and SSB positive. Serum IgG elevated. Overall pattern in addition to facial rash suggestive of SLE. Will defer to Dermatology regarding final  diagnosis. 9. Vitamin B12 low at 287, by neurological standards.  10. Heavy metal screen negative 11. Regarding paraneoplastic syndrome DDx, there were no findings in the chest, abdomen, or pelvis to raise concern for malignancy  Recommendations: 1. Vitamin B12 high-dose supplementation at 2000 mcg per day indefinitely (ordered) 2. Gradually normalize Na levels. Do not correct by more than 10 meq/L per day, or 0.5 meq/L per hour.  3.Continue phenytoin at decreased dose of 50 mg po TID.  4. Stroke work up complete. No changes to current management are recommended. 5. Management of possible lupus per Dermatology or Rheumatology.  6. Neurology will sign off. Please call if there are additional questions.    LOS: 6 days   _0  signed: Dr. Kerney Elbe 03/30/2018  7:29 AM

## 2018-03-31 LAB — CBC
HEMATOCRIT: 33.9 % — AB (ref 36.0–46.0)
Hemoglobin: 11.6 g/dL — ABNORMAL LOW (ref 12.0–15.0)
MCH: 33.1 pg (ref 26.0–34.0)
MCHC: 34.2 g/dL (ref 30.0–36.0)
MCV: 96.9 fL (ref 78.0–100.0)
PLATELETS: 183 10*3/uL (ref 150–400)
RBC: 3.5 MIL/uL — ABNORMAL LOW (ref 3.87–5.11)
RDW: 14.4 % (ref 11.5–15.5)
WBC: 3.1 10*3/uL — AB (ref 4.0–10.5)

## 2018-03-31 LAB — BASIC METABOLIC PANEL
Anion gap: 10 (ref 5–15)
BUN: 21 mg/dL (ref 8–23)
CO2: 25 mmol/L (ref 22–32)
CREATININE: 1.35 mg/dL — AB (ref 0.44–1.00)
Calcium: 7.4 mg/dL — ABNORMAL LOW (ref 8.9–10.3)
Chloride: 91 mmol/L — ABNORMAL LOW (ref 98–111)
GFR, EST AFRICAN AMERICAN: 43 mL/min — AB (ref >60)
GFR, EST NON AFRICAN AMERICAN: 37 mL/min — AB (ref >60)
Glucose, Bld: 99 mg/dL (ref 70–99)
Potassium: 3.5 mmol/L (ref 3.5–5.1)
Sodium: 126 mmol/L — ABNORMAL LOW (ref 135–145)

## 2018-03-31 LAB — COOXEMETRY PANEL
CARBOXYHEMOGLOBIN: 1.7 % — AB (ref 0.5–1.5)
METHEMOGLOBIN: 1 % (ref 0.0–1.5)
O2 SAT: 91.2 %
TOTAL HEMOGLOBIN: 8.4 g/dL — AB (ref 12.0–16.0)

## 2018-03-31 MED ORDER — PHENYTOIN SODIUM EXTENDED 100 MG PO CAPS
100.0000 mg | ORAL_CAPSULE | Freq: Two times a day (BID) | ORAL | Status: DC
  Filled 2018-03-31: qty 1

## 2018-03-31 MED ORDER — PHENYTOIN SODIUM EXTENDED 100 MG PO CAPS
200.0000 mg | ORAL_CAPSULE | Freq: Every day | ORAL | Status: DC
  Administered 2018-04-01: 200 mg via ORAL
  Filled 2018-03-31: qty 2

## 2018-03-31 MED ORDER — PHENYTOIN SODIUM EXTENDED 100 MG PO CAPS
100.0000 mg | ORAL_CAPSULE | Freq: Once | ORAL | Status: AC
  Administered 2018-03-31: 100 mg via ORAL
  Filled 2018-03-31: qty 1

## 2018-03-31 MED ORDER — AMIODARONE HCL 200 MG PO TABS
400.0000 mg | ORAL_TABLET | Freq: Two times a day (BID) | ORAL | Status: DC
  Administered 2018-03-31: 400 mg via ORAL
  Filled 2018-03-31: qty 2

## 2018-03-31 MED ORDER — AMIODARONE HCL 200 MG PO TABS
200.0000 mg | ORAL_TABLET | Freq: Two times a day (BID) | ORAL | Status: DC
  Administered 2018-03-31 – 2018-04-01 (×2): 200 mg via ORAL
  Filled 2018-03-31 (×2): qty 1

## 2018-03-31 NOTE — Discharge Instructions (Signed)

## 2018-03-31 NOTE — Care Management Important Message (Signed)
Important Message  Patient Details  Name: Sharlyn BolognaDeanne Cancro MRN: [id number] Date of Birth: [birth year]   Medicare Important Message Given:  Yes    Seleni Meller P Ellayna Hilligoss 03/31/2018, 3:24 PM

## 2018-03-31 NOTE — Progress Notes (Signed)
Pt just walked with PT. Has a migraine and is sleepy. Ed completed with pt and two children. Discussed plavix, stent, HF, daily wts, low sodium, ex gl, NTG and [facility]II. [facility]. Gave and discussed HF booklet. [id number] Ethelda ChickKristan Reka Wist CES, ACSM 12:02 PM 03/31/2018

## 2018-03-31 NOTE — Progress Notes (Signed)
Notified MD that HR dropped to 38 @2034  shortly stayed then back up 70-80's. EKG done @2050  for MD to review that states 2nd degree AV block. BP 105/53 the only complaint from patient is she feel tired. MD plans to reduce Amiodarone dose to 200 mg bid. I will continue to monitor.

## 2018-03-31 NOTE — Progress Notes (Addendum)
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   Pt underwent successful mRCA PCI with DES placement. Will need triple therapy for 4 weeks, and then drop ASA.   Seen by neurology 03/28/18 for intermittent "uncontrolled shaking/tremors". MRI brain as below.   Remains in NSR this am.   Breathing OK, but has had a migraine since early this am. No tremors. Denies SOB. Walked ~ 300 feet yesterday with cardiac rehab using rollator. Will need one for home. Back in NSR on IV amio.   CVP 6-7 cm. Creatinine up slightly.   EEG 03/29/18 - Normal, awake and asleep.  CT Chest/Abd/Pelvis 03/29/18 1. No findings concerning for malignancy 2. Emphysema 3. Aortic atherosclerosis 4. 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   MR MRA Head 03/29/18 1. Negative posterior circulation aside from tortuous right PCA P1 segment. 2. Distal Right ICA 3-4 mm infundibulum versus aneurysm. Neuro-Interventional Radiology consultation is suggested to evaluate 3. ICA siphon atherosclerosis without stenosis.  MRI brain 03/28/18  1. 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 Atrophy with extensive chronic microvascular ischemia  cMRI 03/28/18 1. Normal LV size with mild LV Hypertrophy. EF 40% with septal-lateral dyssynchrony and diffuse HK. 2. Mildly dilated RV with moderately decreased function, EF 32% 3. BAE 4. No myocardial LGE.  5. Noted to be difficult study due to resp artificat  ECHO EF ~35% by echo EF 40-45% by cath   [facility] 03/27/18  Mid RCA lesion is 90% stenosed.  Prox LAD to Mid LAD lesion is 20% stenosed.  Findings: RA = 5 RV = 58/8 PA = 56/18 (34) PCW = 16 (v = 25) Fick cardiac output/index = 6.8/4.0 PVR =2.7 WU FA sat = 98% PA sat = 77%, 79% SVC sat  = 75%  Objective:   Weight Range:  Vital Signs:   Temp:  [97.7 F (36.5 C)-98.3 F (36.8 C)] 98 F (36.7 C) (08/02 0729) Pulse Rate:  [81-88] 84 (08/02 0729) Resp:  [13-18] 17 (08/02 0729) BP: (109-155)/(52-74) 152/59 (08/02 0729) SpO2:  [90 %-98 %] 90 % (08/02 0729) Weight:  [129 lb 3 oz (58.6 kg)] 129 lb 3 oz (58.6 kg) (08/02 0623) Last BM Date: 03/27/18  Weight change: Filed Weights   03/29/18 0500 03/30/18 0300 03/31/18 0623  Weight: 128 lb 8 oz (58.3 kg) 131 lb 9.8 oz (59.7 kg) 129 lb 3 oz (58.6 kg)    Intake/Output:   Intake/Output Summary (Last 24 hours) at 03/31/2018 0743 Last data filed at 03/31/2018 0700 Gross per 24 hour  Intake 906.6 ml  Output 1950 ml  Net -1043.4 ml     Physical Exam   General:Elderly and chronically ill appearing.  HEENT: Normal anicteric Neck: Supple. JVP 6-7 cm. Carotids 2+ bilat; no bruits. No thyromegaly or nodule noted. Cor: PMI nondisplaced. RRR, No M/G/R noted Lungs: CTAB, normal effort. No wheeze Abdomen: Soft, non-tender, non-distended, no HSM. No bruits or masses. +BS  Extremities: no cyanosis, clubbing, rash, edema Neuro: alert & oriented x 3, cranial nerves grossly intact. moves all 4 extremities w/o difficulty. Affect pleasant  Telemetry   NSR 80-90s, personally reviewed.   Labs   Basic Metabolic Panel: Recent Labs  Lab 03/24/18 1457  03/25/18 1847 03/26/18 0346 03/27/18 0219 03/28/18 0447 03/30/18 0753 03/31/18 0500  NA  133*   < >  --  129* 127* 126* 125* 126*  K 3.9   < > 3.6 3.9 4.9 4.8 4.0 3.5  CL 99   < >  --  92* 92* 95* 88* 91*  CO2 26   < >  --  27 22 23 26 25   GLUCOSE 121*   < >  --  153* 119* 116* 111* 99  BUN 12   < >  --  10 7* 11 14 21   CREATININE 0.72   < >  --  0.79 0.78 0.80 1.14* 1.35*  CALCIUM 8.3*   < >  --  8.1* 8.4* 8.2* 8.5* 7.4*  MG 1.9  --  1.7  --   --   --   --   --    < > = values in this interval not displayed.    Liver Function Tests: Recent Labs  Lab 03/24/18 1457  AST  40  ALT 21  ALKPHOS 162*  BILITOT 0.8  PROT 7.0  ALBUMIN 3.1*   No results for input(s): LIPASE, AMYLASE in the last 168 hours. No results for input(s): AMMONIA in the last 168 hours.  CBC: Recent Labs  Lab 03/24/18 1457 03/27/18 0702 03/28/18 0447 03/29/18 0237 03/30/18 0445 03/31/18 0500  WBC 2.9* 5.0 4.5 3.9* 3.2* 3.1*  NEUTROABS 1.7  --   --   --   --   --   HGB 12.2 13.7 13.2 12.3 13.6 11.6*  HCT 37.5 40.4 38.9 36.9 39.4 33.9*  MCV 101.1* 99.0 98.2 98.9 96.6 96.9  PLT 174 199 192 183 211 183    Cardiac Enzymes: Recent Labs  Lab 03/24/18 1457 03/24/18 2031 03/25/18 0211  TROPONINI <0.03 <0.03 <0.03    BNP: BNP (last 3 results) Recent Labs    03/24/18 1457  BNP 2,425.8*    ProBNP (last 3 results) No results for input(s): PROBNP in the last 8760 hours.    Other results:  Imaging: Ct Chest W Contrast  Result Date: 03/29/2018 CLINICAL DATA:  Pulmonary hypertension shortness of breath. Paraneoplastic syndrome. EXAM: CT CHEST, ABDOMEN, AND PELVIS WITH CONTRAST TECHNIQUE: Multidetector CT imaging of the chest, abdomen and pelvis was performed following the standard protocol during bolus administration of intravenous contrast. CONTRAST:  OMNIPAQUE IOHEXOL 300 MG/ML  SOLN COMPARISON:  None. FINDINGS: CT CHEST FINDINGS Cardiovascular: Heart is markedly enlarged. Trace pericardial effusion evident. Coronary artery calcification is evident. Atherosclerotic calcification is noted in the wall of the thoracic aorta. Right PICC line tip is positioned in the distal SVC near the junction with the RA. Mediastinum/Nodes: No mediastinal lymphadenopathy. There is no hilar lymphadenopathy. The esophagus has normal imaging features. There is no axillary lymphadenopathy. Lungs/Pleura: The central tracheobronchial airways are patent. Centrilobular and paraseptal emphysema noted. Atelectasis or scarring noted in the right middle lobe and lingula. Dependent atelectasis evident in  the lungs bilaterally. No suspicious pulmonary nodule or mass. No pleural effusion. Musculoskeletal: No worrisome lytic or sclerotic osseous abnormality. CT ABDOMEN PELVIS FINDINGS Hepatobiliary: No focal abnormality within the liver parenchyma. There is no evidence for gallstones, gallbladder wall thickening, or pericholecystic fluid. No intrahepatic or extrahepatic biliary dilation. Pancreas: No focal mass lesion. No dilatation of the main duct. No intraparenchymal cyst. No peripancreatic edema. Spleen: No splenomegaly. No focal mass lesion. Adrenals/Urinary Tract: No adrenal nodule or mass. Each kidney demonstrates prominent extrarenal pelvis. 4 mm low-density lesion upper pole right kidney is too small to characterize. No hydroureter. The urinary bladder appears  normal for the degree of distention. Stomach/Bowel: Stomach is nondistended. No gastric wall thickening. No evidence of outlet obstruction. Duodenum is normally positioned as is the ligament of Treitz. No small bowel wall thickening. No small bowel dilatation. The terminal ileum is normal. The appendix is not visualized, but there is no edema or inflammation in the region of the cecum. No gross colonic mass. No colonic wall thickening. No substantial diverticular change. Vascular/Lymphatic: There is abdominal aortic atherosclerosis without aneurysm. Portal vein and superior mesenteric vein are patent. There is no gastrohepatic or hepatoduodenal ligament lymphadenopathy. No intraperitoneal or retroperitoneal lymphadenopathy. Small lymph nodes are seen along each pelvic sidewall without lymphadenopathy. Reproductive: Uterus surgically absent.  There is no adnexal mass. Other: No intraperitoneal free fluid. Musculoskeletal: No worrisome lytic or sclerotic osseous abnormality. Advanced degenerative changes are noted in the lower thoracic and lumbar spine sclerotic focus in the L1 vertebral body is likely a bone island. IMPRESSION: 1. No findings in the  chest, abdomen, or pelvis to raise concern for malignancy. 2.  Emphysema. (ICD10-J43.9) 3.  Aortic Atherosclerois (ICD10-170.0) 4. 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 Electronically Signed   By: Kennith Center M.D.   On: 03/29/2018 20:48   Mr Maxine Glenn Head Wo Contrast  Result Date: 03/29/2018 CLINICAL DATA:  77 year old female with punctate lacunar infarct in the left occipital lobe on brain MRI yesterday performed for choreiform movements. Recent coronary artery angiogram. EXAM: MRA HEAD WITHOUT CONTRAST TECHNIQUE: Angiographic images of the Circle of Willis were obtained using MRA technique without intravenous contrast. COMPARISON:  Brain MRI 03/28/2018. FINDINGS: Stable cerebral morphology with no ventriculomegaly or intracranial mass effect. Antegrade flow in the posterior circulation. Incomplete visualization of the distal vertebral arteries today, but the right appears dominant as seen yesterday. Patent right PICA origin. Patent vertebrobasilar junction. Dominant appearing left AICA. Patent basilar artery without stenosis. Normal SCA and left PCA origin. Tortuous right PCA P1 segment without stenosis. Bilateral PCA branches are within normal limits. Antegrade flow in both ICA siphons. Bilateral siphon irregularity in keeping with atherosclerosis but no hemodynamically significant siphon stenosis identified. However, there is a laterally directed 3-4 millimeter saccular lesion from the right supraclinoid ICA. This is somewhat more laterally directed than typical for a posterior communicating artery or anterior choroidal artery origin. Normal ophthalmic artery origins and left ICA terminus. Normal MCA and ACA origins. Diminutive or absent anterior communicating artery. Visible bilateral ACA branches are within normal limits. MCA M1 segments, bilateral MCA bi/trifurcations, and visible bilateral MCA branches are within normal limits. IMPRESSION: 1. Negative posterior circulation  aside from tortuous right PCA P1 segment. 2. Distal Right ICA 3-4 mm infundibulum versus aneurysm. Neuro-Interventional Radiology consultation is suggested to evaluate the appropriateness of potential treatment. Non-emergent evaluation can be arranged by calling [phone] during usual hours. Emergency evaluation can be requested by paging [phone]. 3. ICA siphon atherosclerosis without stenosis. Electronically Signed   By: Odessa Fleming M.D.   On: 03/29/2018 14:58   Ct Abdomen Pelvis W Contrast  Result Date: 03/29/2018 CLINICAL DATA:  Pulmonary hypertension shortness of breath. Paraneoplastic syndrome. EXAM: CT CHEST, ABDOMEN, AND PELVIS WITH CONTRAST TECHNIQUE: Multidetector CT imaging of the chest, abdomen and pelvis was performed following the standard protocol during bolus administration of intravenous contrast. CONTRAST:  OMNIPAQUE IOHEXOL 300 MG/ML  SOLN COMPARISON:  None. FINDINGS: CT CHEST FINDINGS Cardiovascular: Heart is markedly enlarged. Trace pericardial effusion evident. Coronary artery calcification is evident. Atherosclerotic calcification is noted in the wall of  the thoracic aorta. Right PICC line tip is positioned in the distal SVC near the junction with the RA. Mediastinum/Nodes: No mediastinal lymphadenopathy. There is no hilar lymphadenopathy. The esophagus has normal imaging features. There is no axillary lymphadenopathy. Lungs/Pleura: The central tracheobronchial airways are patent. Centrilobular and paraseptal emphysema noted. Atelectasis or scarring noted in the right middle lobe and lingula. Dependent atelectasis evident in the lungs bilaterally. No suspicious pulmonary nodule or mass. No pleural effusion. Musculoskeletal: No worrisome lytic or sclerotic osseous abnormality. CT ABDOMEN PELVIS FINDINGS Hepatobiliary: No focal abnormality within the liver parenchyma. There is no evidence for gallstones, gallbladder wall thickening, or pericholecystic fluid. No intrahepatic or  extrahepatic biliary dilation. Pancreas: No focal mass lesion. No dilatation of the main duct. No intraparenchymal cyst. No peripancreatic edema. Spleen: No splenomegaly. No focal mass lesion. Adrenals/Urinary Tract: No adrenal nodule or mass. Each kidney demonstrates prominent extrarenal pelvis. 4 mm low-density lesion upper pole right kidney is too small to characterize. No hydroureter. The urinary bladder appears normal for the degree of distention. Stomach/Bowel: Stomach is nondistended. No gastric wall thickening. No evidence of outlet obstruction. Duodenum is normally positioned as is the ligament of Treitz. No small bowel wall thickening. No small bowel dilatation. The terminal ileum is normal. The appendix is not visualized, but there is no edema or inflammation in the region of the cecum. No gross colonic mass. No colonic wall thickening. No substantial diverticular change. Vascular/Lymphatic: There is abdominal aortic atherosclerosis without aneurysm. Portal vein and superior mesenteric vein are patent. There is no gastrohepatic or hepatoduodenal ligament lymphadenopathy. No intraperitoneal or retroperitoneal lymphadenopathy. Small lymph nodes are seen along each pelvic sidewall without lymphadenopathy. Reproductive: Uterus surgically absent.  There is no adnexal mass. Other: No intraperitoneal free fluid. Musculoskeletal: No worrisome lytic or sclerotic osseous abnormality. Advanced degenerative changes are noted in the lower thoracic and lumbar spine sclerotic focus in the L1 vertebral body is likely a bone island. IMPRESSION: 1. No findings in the chest, abdomen, or pelvis to raise concern for malignancy. 2.  Emphysema. (ICD10-J43.9) 3.  Aortic Atherosclerois (ICD10-170.0) 4. 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 Electronically Signed   By: Kennith Center M.D.   On: 03/29/2018 20:48     Medications:     Scheduled Medications: . amLODipine  10 mg Oral Daily  .  apixaban  5 mg Oral BID  . aspirin  81 mg Oral Daily  . atorvastatin  80 mg Oral q1800  . clopidogrel  75 mg Oral Q breakfast  . hydrALAZINE  50 mg Oral Q8H  . hydrocortisone cream   Topical BID  . phenytoin  100 mg Oral BID AC  . phenytoin  200 mg Oral Q breakfast  . sacubitril-valsartan  1 tablet Oral BID  . sodium chloride flush  10-40 mL Intracatheter Q12H  . sodium chloride flush  3 mL Intravenous Q12H  . spironolactone  25 mg Oral Daily  . vitamin B-12  2,000 mcg Oral Daily    Infusions: . sodium chloride    . amiodarone 30 mg/hr (03/30/18 2342)    PRN Medications: sodium chloride, acetaminophen, hydrALAZINE, nitroGLYCERIN, ondansetron (ZOFRAN) IV, polyethylene glycol, sodium chloride flush, sodium chloride flush, traMADol   Assessment:   77 y/o woman admitted 7/26 with acute on chronic systolic HF  Plan/Discussion:    1. 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 Differential also includes ischemic CM, infiltrative CM (amyloid) and CTD-related PAH/HF. Admitted with massive volume overload and NYHA IIIB-IV symptoms - cMRI 03/28/18 with LVEF 40% and no LGE. Full report as above.  - [facility] 5/19 EF ~40% 2-3+ MR  - Echo 03/25/18 EF 35%  - Weight down > 20 lbs.  - Volume status stable to dry. CVP 6-7 cm  - Hold torsemide today with AKI. Likely will need to adjust back.  - Continue UNNA boots - Continue Entresto 97/103.  - Continue carvedilol 3.125 bid - Continue spiro 25 mg daily - Continue amlodipine 10 mg daily for now.  - Continue hydralazine 25 tid can titrate up as needed 2. Severe HTN - Improved. SBP ranges 110-150. No longer requiring clonidine. - Continue amlodipine. Meds as above.  - PRN IV hydralazine for SBP > 160 3. CAD 03/27/18 L/RHC showed 1v CAD with high grade mRCA stenosis (otherwise  non-obstructive CAD) with mild pulmonary HTN with prominent V waves suggestive MR vs significant diastolic dysfunction.  - Continue ASA, plavix, and Eliquis.  - No s/s of ischemia.    - Will drop ASA after 1 month - Will need plavix for at least 6 months. 4. PAF, new onset - NSR this am.  - Continue Amio gtt at least until bag runs out. Will discuss with MD. She was in Afib as recently as yesterday, so may leave gtt running one more day.  May need to consider Tikosyn in the future. - She is now on Eliquis.  - This patients CHA2DS2-VASc Score and unadjusted Ischemic Stroke Rate (% per year) is equal to 9.7 % stroke rate/year from a score of 6 5. Facial rash - Reviewed with Dermatology. 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 + Ro and La antibodies as well as below.  - Continue Hydrocortisone 2.5% to face and neck - No change to current plan.   6. LBBB - Consider CRT as needed. No change.  7. OSA - Continue CPAP qhs. No change. 8. Migraines - Continue dilantin. Do not think this is contributing so dose increased with recurrent migraine.  - + this am. Continue prn pain meds and anti-nausea. 9. Hyponatremia - Na 126 this am.  - Free water restrict.  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  - DDx is broad but includes phenytoin-induced chorea and hyponatremia.  - MRI brain 03/28/18 with acute occipital infarct and chronic microvascular changes.  - Heavy metals negative - IgG + at > 1800. (Normal [phone] mg/dL) - CT Chest/Abdomen/Pelvis 03/29/18 with no evidence for malignancy.  - No change.  11. Rheumatological - ANA +, + SSA (Ro) and SSB (La) Antibodies, IgG. - Will need follow up with Rheumatology on discharge. No change.  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 Felt to be small.  - Will need repeat CTA in February 2020  13. AKI  - Cr 1.3 today from baseline 0.6-0.8 in setting of multiple dose of contrast over past few days.  - Hold torsemide today and follow. May need dose adjust (Is  newly on Entresto). Was on bumex 0.5 mg BID at home PTA.   Length of Stay: [address]  [employer]  03/31/2018, 7:43 AM  Advanced Heart Failure Team Pager [phone] (M-F; 7a - 4p)  Please contact [facility] for night-coverage after hours (4p -7a ) and weekends on [email or web address]  Feels weak again today. Suffering from a migraine. Remains on IV amio and now back in NSR.  Results of multiple tests reviewed. Volume status looks good. Creatinine up slightly but may be in part contrast nephropathy. Walked with cardiac rehab but felt to need a walker. SBP better controlled ranging 110-155. Will have PT/OT see.   Plan for today. 1. Change amio to 400 bid 2. PT/OT to see  3. Hold diuretics   Possibly home tomorrow on: Amiodarone 400 bid x 2 weeks (then 200 bid) Eliquis 5 bid ASA 81 mg daily (stop after 30 days) Plavix 75 mg daily Spironolactone 25mg  daily Amlodipine 10mg  day Phenytoin ER 200/100/100 Torsemide 40 daily Hydralazine 50 tid Hydrocortisone cream 1% for rash on chest HHRN/PT  [place]   Total time spent 45 minutes. Over half that time spent discussing above.   Arvilla Meresaniel Bensimhon, MD  9:03 AM

## 2018-03-31 NOTE — Care Management Note (Addendum)
Case Management Note  Patient Details  Name: [name] BolognaDeanne Hissong MRN: [id number] Date of Birth: Jun 02, 1941  Subjective/Objective:   Pt admitted with HF                 Action/Plan:  PTA from home independent.  Pt already has CPAP at home - simply needs home oxygen.  CM requested HF team to correct home oxygen order and [facility]    Expected Discharge Date:                  Expected Discharge Plan:  Home w Home Health Services  In-House Referral:     Discharge planning Services  CM Consult  Post Acute Care Choice:    Choice offered to:  Patient  DME Arranged:  Walker rolling with seat DME Agency:  [facility]  [facility]  [facility]  Status of Service:  In process, will continue to follow  If discussed at Long Length of Stay Meetings, dates discussed:    Additional Comments: 03/31/2018 CM provided both Entresto and Eliquis free 30 day cards.  Attending group confirmed [facility]  [facility] [place] - pharmacy can fill prescription and confirmed they will accept cards.  Per attending PA- pt will not require increase in home oxygen at night - pt will continue on 1 liter bleed in with home CPAP at night.  Pt weighs daily and adheres to low salt diet.  [facility]- [facility] with probable discharge tomorrow  03/27/18 Pt informed CM that [facility] bleed in to CPAP at night.  IF pt will now need 1.5 liter bleed in an actual Home Oxygen DME order will be required [facility]- [facility] confirmed that a change in liter flow does not require a new pulm ambulatory note. Cherylann ParrClaxton, Shontae Rosiles S, RN 03/31/2018, 2:10 PM

## 2018-03-31 NOTE — Evaluation (Signed)
Physical Therapy Evaluation Patient Details Name: [name] Rosales MRN: [id number] DOB: Apr 03, 1941 Today's Date: 03/31/2018   History of Present Illness  Pt is a 77 y.o. 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 Heart cath scheduled 7/29. Now s/p PCI with DES placement.  PMH includes HTN, OSA, LBBB.   Clinical Impression  Pt admitted with above diagnosis. Pt currently with functional limitations due to the deficits listed below (see PT Problem List). Patient limited by migraine this visit and unable to initiate stair training for safe return home. Family will stay with patient initially, PTA pt living alone independent. Pt min guard level for OOB mobility, satting well on RA. Will plan to see patient tomorrow prior to anticipated discharge for stair training.  Pt will benefit from skilled PT to increase their independence and safety with mobility to allow discharge to the venue listed below.       Follow Up Recommendations Home health PT;Supervision/Assistance - 24 hour;Other (comment)(OP Cardiac Rehab )    Equipment Recommendations  Aeronautical engineer)    Recommendations for Other Services       Precautions / Restrictions Precautions Precautions: Fall Restrictions Weight Bearing Restrictions: No      Mobility  Bed Mobility                  Transfers Overall transfer level: Needs assistance Equipment used: Rolling walker (2 wheeled) Transfers: Sit to/from Stand Sit to Stand: Supervision            Ambulation/Gait Ambulation/Gait assistance: Min guard Gait Distance (Feet): 100 Feet Assistive device: 4-wheeled walker Gait Pattern/deviations: Step-through pattern;Decreased stride length;Wide base of support Gait velocity: decreased   General Gait Details: pt limited with migraine this session, ambulating unsteady, discussed with daugther to supervise patient with stand by asssistance if  returning home today/tomorrow.   Stairs            Wheelchair Mobility    Modified Rankin (Stroke Patients Only)       Balance Overall balance assessment: Needs assistance   Sitting balance-Leahy Scale: Good       Standing balance-Leahy Scale: Fair                               Pertinent Vitals/Pain Pain Assessment: Faces Faces Pain Scale: Hurts little more Pain Location: headache Pain Descriptors / Indicators: Constant Pain Intervention(s): Limited activity within patient's tolerance;Monitored during session    Home Living Family/patient expects to be discharged to:: Private residence Living Arrangements: Alone Available Help at Discharge: Family Type of Home: House Home Access: Stairs to enter Entrance Stairs-Rails: None Entrance Stairs-Number of Steps: 3 w/ no rail in front; level entry in garage, but then up 13 steps w/ rail to main level Home Layout: Two level Home Equipment: None      Prior Function Level of Independence: Independent         Comments: since may, pt declined family helping with chores and driving, grocery.      Hand Dominance        Extremity/Trunk Assessment   Upper Extremity Assessment Upper Extremity Assessment: Overall WFL for tasks assessed    Lower Extremity Assessment Lower Extremity Assessment: Overall WFL for tasks assessed       Communication   Communication: No difficulties  Cognition Arousal/Alertness: Awake/alert Behavior During Therapy: WFL for tasks assessed/performed Overall Cognitive Status: Within Functional Limits for  tasks assessed                                        General Comments      Exercises     Assessment/Plan    PT Assessment Patient needs continued PT services  PT Problem List Decreased strength;Decreased range of motion;Decreased balance;Decreased mobility;Decreased activity tolerance       PT Treatment Interventions DME instruction;Gait  training;Stair training;Functional mobility training;Therapeutic activities;Therapeutic exercise    PT Goals (Current goals can be found in the Care Plan section)  Acute Rehab PT Goals Patient Stated Goal: go home tomorrow PT Goal Formulation: With patient Time For Goal Achievement: 04/07/18 Potential to Achieve Goals: Good    Frequency Min 3X/week   Barriers to discharge        Co-evaluation               AM-PAC PT "6 Clicks" Daily Activity  Outcome Measure Difficulty turning over in bed (including adjusting bedclothes, sheets and blankets)?: A Little Difficulty moving from lying on back to sitting on the side of the bed? : A Little Difficulty sitting down on and standing up from a chair with arms (e.g., wheelchair, bedside commode, etc,.)?: A Little Help needed moving to and from a bed to chair (including a wheelchair)?: A Little Help needed walking in hospital room?: A Little Help needed climbing 3-5 steps with a railing? : A Little 6 Click Score: 18    End of Session Equipment Utilized During Treatment: Gait belt Activity Tolerance: Patient tolerated treatment well Patient left: in bed;with call bell/phone within reach;with family/visitor present Nurse Communication: Mobility status PT Visit Diagnosis: Other abnormalities of gait and mobility (R26.89)    Time: 1020-1050 PT Time Calculation (min) (ACUTE ONLY): 30 min   Charges:   PT Evaluation $PT Eval Low Complexity: 1 Low PT Treatments $Gait Training: 8-22 mins        [name] Rosales, PT, DPT Acute Rehab Services Pager: [phone]    [name] Rosales 03/31/2018, 11:00 AM

## 2018-04-01 ENCOUNTER — Telehealth: Payer: Self-pay | Admitting: Cardiology

## 2018-04-01 ENCOUNTER — Other Ambulatory Visit: Payer: Self-pay | Admitting: Cardiology

## 2018-04-01 DIAGNOSIS — I10 Essential (primary) hypertension: Secondary | ICD-10-CM

## 2018-04-01 DIAGNOSIS — I480 Paroxysmal atrial fibrillation: Secondary | ICD-10-CM

## 2018-04-01 DIAGNOSIS — G253 Myoclonus: Secondary | ICD-10-CM

## 2018-04-01 LAB — BASIC METABOLIC PANEL
Anion gap: 10 (ref 5–15)
BUN: 27 mg/dL — ABNORMAL HIGH (ref 8–23)
CALCIUM: 8.5 mg/dL — AB (ref 8.9–10.3)
CO2: 26 mmol/L (ref 22–32)
CREATININE: 1.87 mg/dL — AB (ref 0.44–1.00)
Chloride: 89 mmol/L — ABNORMAL LOW (ref 98–111)
GFR, EST AFRICAN AMERICAN: 29 mL/min — AB (ref >60)
GFR, EST NON AFRICAN AMERICAN: 25 mL/min — AB (ref >60)
Glucose, Bld: 106 mg/dL — ABNORMAL HIGH (ref 70–99)
Potassium: 4.4 mmol/L (ref 3.5–5.1)
SODIUM: 125 mmol/L — AB (ref 135–145)

## 2018-04-01 LAB — CBC
HCT: 39.3 % (ref 36.0–46.0)
HEMOGLOBIN: 13.3 g/dL (ref 12.0–15.0)
MCH: 33 pg (ref 26.0–34.0)
MCHC: 33.8 g/dL (ref 30.0–36.0)
MCV: 97.5 fL (ref 78.0–100.0)
PLATELETS: 219 10*3/uL (ref 150–400)
RBC: 4.03 MIL/uL (ref 3.87–5.11)
RDW: 14.4 % (ref 11.5–15.5)
WBC: 3.3 10*3/uL — ABNORMAL LOW (ref 4.0–10.5)

## 2018-04-01 LAB — COOXEMETRY PANEL
CARBOXYHEMOGLOBIN: 1.4 % (ref 0.5–1.5)
METHEMOGLOBIN: 0.8 % (ref 0.0–1.5)
O2 Saturation: 81.2 %
TOTAL HEMOGLOBIN: 13.9 g/dL (ref 12.0–16.0)

## 2018-04-01 MED ORDER — TORSEMIDE 20 MG PO TABS: 40.0000 mg | ORAL_TABLET | Freq: Every day | ORAL | 5 refills | Status: DC

## 2018-04-01 MED ORDER — NITROGLYCERIN 0.4 MG SL SUBL: 0.4000 mg | SUBLINGUAL_TABLET | SUBLINGUAL | 2 refills | Status: AC | PRN

## 2018-04-01 MED ORDER — SPIRONOLACTONE 25 MG PO TABS: 25.0000 mg | ORAL_TABLET | Freq: Every day | ORAL | Status: AC

## 2018-04-01 MED ORDER — CLOPIDOGREL BISULFATE 75 MG PO TABS: 75.0000 mg | ORAL_TABLET | Freq: Every day | ORAL | 5 refills | Status: AC

## 2018-04-01 MED ORDER — ATORVASTATIN CALCIUM 80 MG PO TABS: 80.0000 mg | ORAL_TABLET | Freq: Every day | ORAL | 5 refills | Status: AC

## 2018-04-01 MED ORDER — HYDROCORTISONE 1 % EX CREA: TOPICAL_CREAM | Freq: Two times a day (BID) | CUTANEOUS | 0 refills | Status: DC

## 2018-04-01 MED ORDER — AMIODARONE HCL 200 MG PO TABS: 200.0000 mg | ORAL_TABLET | Freq: Two times a day (BID) | ORAL | 5 refills | Status: AC

## 2018-04-01 MED ORDER — PHENYTOIN SODIUM EXTENDED 100 MG PO CAPS
100.0000 mg | ORAL_CAPSULE | ORAL | Status: AC
Start: 2018-04-01 — End: ?

## 2018-04-01 MED ORDER — ASPIRIN 81 MG PO CHEW: 81.0000 mg | CHEWABLE_TABLET | Freq: Every day | ORAL | Status: DC

## 2018-04-01 MED ORDER — APIXABAN 5 MG PO TABS: 5.0000 mg | ORAL_TABLET | Freq: Two times a day (BID) | ORAL | 5 refills | Status: DC

## 2018-04-01 MED ORDER — SACUBITRIL-VALSARTAN 49-51 MG PO TABS
1.0000 | ORAL_TABLET | Freq: Two times a day (BID) | ORAL | Status: DC
  Filled 2018-04-01: qty 1

## 2018-04-01 MED ORDER — HYDRALAZINE HCL 50 MG PO TABS: 50.0000 mg | ORAL_TABLET | Freq: Three times a day (TID) | ORAL | 5 refills | Status: DC

## 2018-04-01 MED ORDER — SACUBITRIL-VALSARTAN 49-51 MG PO TABS: 1.0000 | ORAL_TABLET | Freq: Two times a day (BID) | ORAL | 5 refills | Status: DC

## 2018-04-01 NOTE — Discharge Summary (Signed)
Discharge Summary    Patient ID: [name] Rosales,  MRN: [id number], DOB/AGE: Dec 29, 1940 77 y.o.  Admit date: 03/24/2018 Discharge date: 04/01/2018  Primary Care Provider: No primary care provider on file. Primary Cardiologist: Glori Bickers, MD   Discharge Diagnoses    Principal Problem:   Acute on chronic systolic heart failure [facility]) Active Problems:   Coronary artery disease involving native coronary artery of native heart without angina pectoris   PAF (paroxysmal atrial fibrillation) (HCC)   Severe hypertension   Myoclonic jerking   History of Present Illness     [name] Rosales is a 77 y.o. female with past medical history of HTN, LBBB, OSA (on CPAP) and recently diagnosed systolic CHF who presented to the [facility] on 03/24/2018 to establish care. She was noted to be significantly volume overloaded and was directly admitted to [facility] for IV diuresis and further management.   Hospital Course     Consultants: Neurology  A repeat echocardiogram was obtained and showed a reduced LVEF of 30-35%, severe global hypokinesis with incoordinate septal motion, grade 2 DD with high LV filling pressure, mild to moderate MR, severe biatrial enlargment, and moderate TR. She responded well to IV Lasix and once her respiratory status improved, a R/LHC was performed on 03/27/2018 and showed severe 90% stenosis along the mid-RCA and 20% Prox-LAD stenosis. She underwent Coronary Stent Intervention by Dr. Tamala Julian later that day with DES placement to the mid-RCA. It was recommended she be on triple therapy with ASA, Plavix, and Eliquis for 4 weeks then discontinue ASA.   She did develop atrial fibrillation during admission which led to the initiation of Eliquis 56m BID following her catheterization. Did convert back to NSR on 03/27/2018 following initiation of Amiodarone but had recurrence on 7/30. Also on 7/30, she developed uncontrollable shaking/tremors which led to a Neurology  consult. 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 It was recommended she follow-up with Rheumatology as an outpatient given her positive ASA and SSA ([place]) and SSB ([place]).   She continued to progress well throughout admission and was evaluated by Dr. MAundra Dubinon 04/01/2018 and deemed stable for discharge. Weight was down to 126 lbs but creatinine was trending upwards to 1.87 (previously 1.35 on 03/31/2018). She had an overall recorded output of -13.7 L this admission. Given her rising creatinine, Torsemide was held at the time of discharge with plans to restart on Monday at 48mdaily. Will also be on Entresto 49/5129mID, Spironolactone 35m63mily, Amlodipine 10mg59mly, and Hydralazine 50mg 75min regards to her Cardiomyopathy (previously on Coreg but this was discontinued during admission). Medications regarding her CAD/PCI are outlined above. For atrial fibrillation, she will be on Eliquis for anticoagulation along with Amiodarone 200mg B71mor 1 week then reduce to 200mg da83m Other discharge medications are outlined below.   She was discharged home in stable condition. Will need a BMET on Monday ([facility].  _____________  Discharge Vitals Blood pressure 117/60, pulse 91, temperature 97.9 F (36.6 C), temperature source Oral, resp. rate 17, height 5' 6"  (1.676 m), weight 126 lb 8.7 oz (57.4 kg), SpO2 98 %.  Filed Weights   03/30/18 0300 03/31/18 0623 04/01/18 0528  Weight: 131 lb 9.8 oz (59.7 kg) 129 lb 3 oz (58.6 kg) 126 lb  8.7 oz (57.4 kg)    Labs & Radiologic Studies     CBC Recent Labs    03/31/18 0500 04/01/18 0550  WBC 3.1* 3.3*  HGB 11.6* 13.3  HCT 33.9* 39.3  MCV 96.9 97.5  PLT 183 009   Basic Metabolic Panel Recent Labs    03/31/18 0500 04/01/18 0550  NA 126* 125*  K  3.5 4.4  CL 91* 89*  CO2 25 26  GLUCOSE 99 106*  BUN 21 27*  CREATININE 1.35* 1.87*  CALCIUM 7.4* 8.5*   Liver Function Tests No results for input(s): AST, ALT, ALKPHOS, BILITOT, PROT, ALBUMIN in the last 72 hours. No results for input(s): LIPASE, AMYLASE in the last 72 hours. Cardiac Enzymes No results for input(s): CKTOTAL, CKMB, CKMBINDEX, TROPONINI in the last 72 hours. BNP Invalid input(s): POCBNP D-Dimer No results for input(s): DDIMER in the last 72 hours. Hemoglobin A1C No results for input(s): HGBA1C in the last 72 hours. Fasting Lipid Panel No results for input(s): CHOL, HDL, LDLCALC, TRIG, CHOLHDL, LDLDIRECT in the last 72 hours. Thyroid Function Tests No results for input(s): TSH, T4TOTAL, T3FREE, THYROIDAB in the last 72 hours.  Invalid input(s): FREET3  Ct Chest W Contrast  Result Date: 03/29/2018 CLINICAL DATA:  Pulmonary hypertension shortness of breath. Paraneoplastic syndrome. EXAM: CT CHEST, ABDOMEN, AND PELVIS WITH CONTRAST TECHNIQUE: Multidetector CT imaging of the chest, abdomen and pelvis was performed following the standard protocol during bolus administration of intravenous contrast. CONTRAST:  156m OMNIPAQUE IOHEXOL 300 MG/ML  SOLN COMPARISON:  None. FINDINGS: CT CHEST FINDINGS Cardiovascular: Heart is markedly enlarged. Trace pericardial effusion evident. Coronary artery calcification is evident. Atherosclerotic calcification is noted in the wall of the thoracic aorta. Right PICC line tip is positioned in the distal SVC near the junction with the RA. Mediastinum/Nodes: No mediastinal lymphadenopathy. There is no hilar lymphadenopathy. The esophagus has normal imaging features. There is no axillary lymphadenopathy. Lungs/Pleura: The central tracheobronchial airways are patent. Centrilobular and paraseptal emphysema noted. Atelectasis or scarring noted in the right middle lobe and lingula. Dependent atelectasis evident in the lungs bilaterally. No suspicious  pulmonary nodule or mass. No pleural effusion. Musculoskeletal: No worrisome lytic or sclerotic osseous abnormality. CT ABDOMEN PELVIS FINDINGS Hepatobiliary: No focal abnormality within the liver parenchyma. There is no evidence for gallstones, gallbladder wall thickening, or pericholecystic fluid. No intrahepatic or extrahepatic biliary dilation. Pancreas: No focal mass lesion. No dilatation of the main duct. No intraparenchymal cyst. No peripancreatic edema. Spleen: No splenomegaly. No focal mass lesion. Adrenals/Urinary Tract: No adrenal nodule or mass. Each kidney demonstrates prominent extrarenal pelvis. 4 mm low-density lesion upper pole right kidney is too small to characterize. No hydroureter. The urinary bladder appears normal for the degree of distention. Stomach/Bowel: Stomach is nondistended. No gastric wall thickening. No evidence of outlet obstruction. Duodenum is normally positioned as is the ligament of Treitz. No small bowel wall thickening. No small bowel dilatation. The terminal ileum is normal. The appendix is not visualized, but there is no edema or inflammation in the region of the cecum. No gross colonic mass. No colonic wall thickening. No substantial diverticular change. Vascular/Lymphatic: There is abdominal aortic atherosclerosis without aneurysm. Portal vein and superior mesenteric vein are patent. There is no gastrohepatic or hepatoduodenal ligament lymphadenopathy. No intraperitoneal or retroperitoneal lymphadenopathy. Small lymph nodes are seen along each pelvic sidewall without lymphadenopathy. Reproductive: Uterus surgically absent.  There is no adnexal mass. Other: No intraperitoneal free fluid. Musculoskeletal: No worrisome lytic or sclerotic osseous abnormality. Advanced  degenerative changes are noted in the lower thoracic and lumbar spine sclerotic focus in the L1 vertebral body is likely a bone island. IMPRESSION: 1. No findings in the chest, abdomen, or pelvis to raise concern  for malignancy. 2.  Emphysema. (ICD10-J43.9) 3.  Aortic Atherosclerois (ICD10-170.0) 4. 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 Electronically Signed   By: Misty Stanley M.D.   On: 03/29/2018 20:48   Mr Jodene Nam Head Wo Contrast  Result Date: 03/29/2018 CLINICAL DATA:  77 year old female with punctate lacunar infarct in the left occipital lobe on brain MRI yesterday performed for choreiform movements. Recent coronary artery angiogram. EXAM: MRA HEAD WITHOUT CONTRAST TECHNIQUE: Angiographic images of the Circle of Willis were obtained using MRA technique without intravenous contrast. COMPARISON:  Brain MRI 03/28/2018. FINDINGS: Stable cerebral morphology with no ventriculomegaly or intracranial mass effect. Antegrade flow in the posterior circulation. Incomplete visualization of the distal vertebral arteries today, but the right appears dominant as seen yesterday. Patent right PICA origin. Patent vertebrobasilar junction. Dominant appearing left AICA. Patent basilar artery without stenosis. Normal SCA and left PCA origin. Tortuous right PCA P1 segment without stenosis. Bilateral PCA branches are within normal limits. Antegrade flow in both ICA siphons. Bilateral siphon irregularity in keeping with atherosclerosis but no hemodynamically significant siphon stenosis identified. However, there is a laterally directed 3-4 millimeter saccular lesion from the right supraclinoid ICA. This is somewhat more laterally directed than typical for a posterior communicating artery or anterior choroidal artery origin. Normal ophthalmic artery origins and left ICA terminus. Normal MCA and ACA origins. Diminutive or absent anterior communicating artery. Visible bilateral ACA branches are within normal limits. MCA M1 segments, bilateral MCA bi/trifurcations, and visible bilateral MCA branches are within normal limits. IMPRESSION: 1. Negative posterior circulation aside from tortuous right PCA P1 segment.  2. Distal Right ICA 3-4 mm infundibulum versus aneurysm. Neuro-Interventional Radiology consultation is suggested to evaluate the appropriateness of potential treatment. Non-emergent evaluation can be arranged by calling [phone] during usual hours. Emergency evaluation can be requested by paging [phone]. 3. ICA siphon atherosclerosis without stenosis. Electronically Signed   By: Genevie Ann M.D.   On: 03/29/2018 14:58   Mr Brain Wo Contrast  Result Date: 03/28/2018 CLINICAL DATA:  [place] movements EXAM: MRI HEAD WITHOUT CONTRAST TECHNIQUE: Multiplanar, multiecho pulse sequences of the brain and surrounding structures were obtained without intravenous contrast. COMPARISON:  None. FINDINGS: Brain: Small 5 mm area of acute infarct in the left lateral occipital lobe. No other acute infarct. Mild atrophy. Extensive chronic microvascular ischemic changes in the white matter and pons. Negative for hemorrhage or mass. No fluid collection or midline shift. Vascular: Normal arterial flow voids Skull and upper cervical spine: Negative Sinuses/Orbits: Mucosal edema in the paranasal sinuses. This is most prominent in the right sphenoid sinus. Bilateral cataract removal. 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 Electronically Signed   By: Franchot Gallo M.D.   On: 03/28/2018 15:12   Ct Abdomen Pelvis W Contrast  Result Date: 03/29/2018 CLINICAL DATA:  Pulmonary hypertension shortness of breath. Paraneoplastic syndrome. EXAM: CT CHEST, ABDOMEN, AND PELVIS WITH CONTRAST TECHNIQUE: Multidetector CT imaging of the chest, abdomen and pelvis was performed following the standard protocol during bolus administration of intravenous contrast. CONTRAST:  128m OMNIPAQUE IOHEXOL 300 MG/ML  SOLN COMPARISON:  None. FINDINGS: CT CHEST FINDINGS Cardiovascular: Heart is markedly enlarged. Trace pericardial effusion evident. Coronary artery  calcification is evident. Atherosclerotic calcification  is noted in the wall of the thoracic aorta. Right PICC line tip is positioned in the distal SVC near the junction with the RA. Mediastinum/Nodes: No mediastinal lymphadenopathy. There is no hilar lymphadenopathy. The esophagus has normal imaging features. There is no axillary lymphadenopathy. Lungs/Pleura: The central tracheobronchial airways are patent. Centrilobular and paraseptal emphysema noted. Atelectasis or scarring noted in the right middle lobe and lingula. Dependent atelectasis evident in the lungs bilaterally. No suspicious pulmonary nodule or mass. No pleural effusion. Musculoskeletal: No worrisome lytic or sclerotic osseous abnormality. CT ABDOMEN PELVIS FINDINGS Hepatobiliary: No focal abnormality within the liver parenchyma. There is no evidence for gallstones, gallbladder wall thickening, or pericholecystic fluid. No intrahepatic or extrahepatic biliary dilation. Pancreas: No focal mass lesion. No dilatation of the main duct. No intraparenchymal cyst. No peripancreatic edema. Spleen: No splenomegaly. No focal mass lesion. Adrenals/Urinary Tract: No adrenal nodule or mass. Each kidney demonstrates prominent extrarenal pelvis. 4 mm low-density lesion upper pole right kidney is too small to characterize. No hydroureter. The urinary bladder appears normal for the degree of distention. Stomach/Bowel: Stomach is nondistended. No gastric wall thickening. No evidence of outlet obstruction. Duodenum is normally positioned as is the ligament of Treitz. No small bowel wall thickening. No small bowel dilatation. The terminal ileum is normal. The appendix is not visualized, but there is no edema or inflammation in the region of the cecum. No gross colonic mass. No colonic wall thickening. No substantial diverticular change. Vascular/Lymphatic: There is abdominal aortic atherosclerosis without aneurysm. Portal vein and superior mesenteric vein are patent.  There is no gastrohepatic or hepatoduodenal ligament lymphadenopathy. No intraperitoneal or retroperitoneal lymphadenopathy. Small lymph nodes are seen along each pelvic sidewall without lymphadenopathy. Reproductive: Uterus surgically absent.  There is no adnexal mass. Other: No intraperitoneal free fluid. Musculoskeletal: No worrisome lytic or sclerotic osseous abnormality. Advanced degenerative changes are noted in the lower thoracic and lumbar spine sclerotic focus in the L1 vertebral body is likely a bone island. IMPRESSION: 1. No findings in the chest, abdomen, or pelvis to raise concern for malignancy. 2.  Emphysema. (ICD10-J43.9) 3.  Aortic Atherosclerois (ICD10-170.0) 4. 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 Electronically Signed   By: Misty Stanley M.D.   On: 03/29/2018 20:48   Dg Chest Port 1 View  Result Date: 03/24/2018 CLINICAL DATA:  Shortness of breath. EXAM: PORTABLE CHEST 1 VIEW COMPARISON:  None. FINDINGS: Cardiomegaly identified. There is no evidence of focal airspace disease, pulmonary edema, suspicious pulmonary nodule/mass, pleural effusion, or pneumothorax. No acute bony abnormalities are identified. IMPRESSION: Cardiomegaly without evidence of acute cardiopulmonary disease. Electronically Signed   By: Margarette Canada M.D.   On: 03/24/2018 20:05   Mr Cardiac Morphology W Wo Contrast  Result Date: 03/28/2018 CLINICAL DATA:  Cardiomyopathy of uncertain etiology. EXAM: CARDIAC MRI TECHNIQUE: The patient was scanned on a 1.5 Tesla GE magnet. A dedicated cardiac coil was used. Functional imaging was done using Fiesta sequences. 2,3, and 4 chamber views were done to assess for RWMA's. Modified Simpson's rule using a short axis stack was used to calculate an ejection fraction on a dedicated work Conservation officer, nature. The patient received 28 cc of Multihance. After 10 minutes inversion recovery sequences were used to assess for infiltration and scar  tissue. FINDINGS: Difficult study, patient had a hard time holding her breath so images were not ideal. Limited images of the lung fields showed no gross abnormalities. Small pericardial effusion. Normal left ventricular size with mild  LV hypertrophy. Septal-lateral dyssynchrony consistent with LBBB. EF 40%, diffuse hypokinesis. Mildly dilated right ventricle with moderate systolic dysfunction, EF 23%. Severe left atrial enlargement. Moderate right atrial enlargement. Images were too poor to comment on degree of valvular regurgitation though there did appear to be mitral and tricuspid regurgitation. On delayed enhancement imaging, there was no significant late gadolinium enhancement (LGE) noted. Measurements: LVEDV 173 mL LVSV 69 mL LVEF 40% RVEDV 144 mL RVSV 46 mL RVEF 32% IMPRESSION: 1. Normal left ventricular size with mild LV hypertrophy. EF 40%, diffuse hypokinesis with septal-lateral dyssynchrony. 2. Mildly dilated RV with moderate decreased systolic function, EF 53%. 3.  Biatrial enlargement. 4. No myocardial LGE, so no definitive evidence for prior MI, infiltrative disease, or myocarditis. Difficult study due to respiratory artifact. Dalton Mclean Electronically Signed   By: Loralie Champagne M.D.   On: 03/28/2018 21:12   [place] Ekg Site Rite  Result Date: 03/24/2018 If Site Rite image not attached, placement could not be confirmed due to current cardiac rhythm.    Diagnostic Studies/Procedures     Cardiac Catheterization: 03/27/2018  Mid RCA lesion is 90% stenosed.  Prox LAD to Mid LAD lesion is 20% stenosed.   Findings:  RA = 5 RV = 58/8 PA = 56/18 (34) PCW = 16 (v = 25) Fick cardiac output/index = 6.8/4.0 PVR =2.7 WU FA sat = 98% PA sat = 77%, 79% SVC sat = 75%  Assessment:  1. 1v CAD with high-grade mRCA stenosis otherwise normal coronaries 2. EF ~40-45% by LV gram 3. Mild pulmonary HTN with prominent v waves in PCE tracing suggestive of mitral regurgitation (versus  significant diastolic dysfunction)  Plan/Discussion:  PCI of RCA with Dr. Tamala Julian. With PAF will likely use Plavix to permit Plavix/Eliquis/ASA for 1 month then drop ASA. cMRI tomorrow.  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    Coronary Stent Intervention: 03/27/2018  A stent was successfully placed.     Successful mid RCA PCI reducing greater than 95% stenosis with TIMI-3 flow to to 0% with TIMI grade III flow.  Final post dilatation pressure was 14 atm using the device balloon.  RECOMMENDATIONS:    Aspirin, Plavix, and Eliquis for 2 to 4 weeks then discontinue aspirin.  Risk factor modification: LDL less than 70, blood pressure 130/80 mmHg, glycemic control, aerobic activity as tolerated.   Recommend to resume Apixaban, at currently prescribed dose and frequency, on 03/27/18 8PM.  Recommend concurrent antiplatelet therapy of Aspirin 28m daily for 2 to 4 weeks. and clopidogrel for at least 6 months after aspirin discontinued.   Echocardiogram: 03/25/2018 Study Conclusions  - Left ventricle: The cavity size was normal. Wall thickness was   normal. Calcified LV apical false tendon. Systolic function was   moderately to severely reduced. The estimated ejection fraction   was in the range of 30% to 35%. Diffuse hypokinesis and   incoordinate septal motion. Doppler parameters are consistent   with pseudonormal left ventricular relaxation (grade 2 diastolic   dysfunction). The E/e&' ratio is >15, suggesting elevated LV   filling pressure. - Mitral valve: Calcified leafelts and chordae. Mild to moderate   regurgitation. - Left atrium: Severely dilated. - Right ventricle: The cavity size was moderately dilated. Systolic   function was low normal. - Right atrium: Severely dilated. - Tricuspid valve: There was moderate regurgitation. - Pulmonary arteries: PA peak pressure: 55 mm Hg (S). - Inferior vena cava: The vessel was dilated. The respirophasic    diameter  changes were blunted (< 50%), consistent with elevated   central venous pressure.  Impressions:  - LVEF 30-35%, normal wall thickness, severe global hypokinesis   with incoordinate septal motion, grade 2 DD with high LV filling   pressure, mild to moderate MR, severe biatrial enlargment,   moderate TR, RVSP 55 mmHg, dilated IVC, dilated coronary sinus.  Disposition   Pt is being discharged home today in good condition.  Follow-up Plans & Appointments    Follow-up Information    Bensimhon, Shaune Pascal, MD Follow up on 04/07/2018.   Specialty:  Cardiology Why:  at 1130 for post hospital follow up. The code for parking is 1500. Leisure centre manager through [employer] off of [address], underground parking on your right. Can also park in lower ED lot and enter thru blue awning.  Contact information: [place] [address] [place] Follow up.   Why:  rollator Contact information: [address] [place] [address] [phone]        [facility] Follow up.   Specialty:  Home Health Services Why:  Physical therapy Contact information: [address] [place] [address] [phone]        Family Provider Follow up.   Why:  You will need to have blood work by your PCP on Monday (Basic Metabolic Panel) and this will need to be faxed to Dr. Clayborne Dana office. Fax Number is [phone]         Discharge Instructions    Amb Referral to Cardiac Rehabilitation   Complete by:  As directed    To [place] [facility]   Diagnosis:   Coronary Stents PTCA        Discharge Medications     Medication List    STOP taking these medications   bumetanide 0.5 MG tablet Commonly known as:  BUMEX   candesartan 32 MG tablet Commonly known as:  ATACAND   cloNIDine 0.1 MG tablet Commonly known as:  CATAPRES   magnesium oxide 400 MG tablet Commonly known as:  MAG-OX     metoprolol succinate 100 MG 24 hr tablet Commonly known as:  TOPROL-XL   Potassium 99 MG Tabs     TAKE these medications   acetaminophen 500 MG tablet Commonly known as:  TYLENOL Take 500 mg by mouth daily as needed.   amiodarone 200 MG tablet Commonly known as:  PACERONE Take 1 tablet (200 mg total) by mouth 2 (two) times daily. On 04/08/2018, reduce to 1 tablet (243m) once daily.   amLODipine 10 MG tablet Commonly known as:  NORVASC Take 10 mg by mouth daily.   apixaban 5 MG Tabs tablet Commonly known as:  ELIQUIS Take 1 tablet (5 mg total) by mouth 2 (two) times daily.   aspirin 81 MG chewable tablet Chew 1 tablet (81 mg total) by mouth daily. STOP TAKING on 04/27/2018. Start taking on:  04/02/2018   atorvastatin 80 MG tablet Commonly known as:  LIPITOR Take 1 tablet (80 mg total) by mouth daily at 6 PM.   calcium carbonate 500 MG chewable tablet Commonly known as:  TUMS - dosed in mg elemental calcium Chew 2 tablets by mouth daily.   clopidogrel 75 MG tablet Commonly known as:  PLAVIX Take 1 tablet (75 mg total) by mouth daily with breakfast. Start taking on:  04/02/2018   hydrALAZINE 50 MG tablet Commonly known as:  APRESOLINE Take 1 tablet (  50 mg total) by mouth every 8 (eight) hours.   hydrocortisone cream 1 % Apply topically 2 (two) times daily.   multivitamin-lutein Caps capsule Take 1 capsule by mouth daily.   nitroGLYCERIN 0.4 MG SL tablet Commonly known as:  NITROSTAT Place 1 tablet (0.4 mg total) under the tongue every 5 (five) minutes as needed for chest pain.   NON FORMULARY CPAP   phenytoin 100 MG ER capsule Commonly known as:  DILANTIN Take 1-2 capsules (100-200 mg total) by mouth See admin instructions. Take 200 mg every morning  Take 100 mg midday (12 pm) Take 100 mg at bedtime What changed:  additional instructions   sacubitril-valsartan 49-51 MG Commonly known as:  ENTRESTO Take 1 tablet by mouth 2 (two) times daily.    spironolactone 25 MG tablet Commonly known as:  ALDACTONE Take 1 tablet (25 mg total) by mouth daily. What changed:  when to take this   torsemide 20 MG tablet Commonly known as:  DEMADEX Take 2 tablets (40 mg total) by mouth daily. Start taking on:  04/03/2018       Allergies No Known Allergies  Acute coronary syndrome (MI, NSTEMI, STEMI, etc) this admission?: No.   Outstanding Labs/Studies   BMET on Monday.   Duration of Discharge Encounter   Greater than 30 minutes including physician time.  Signed, Erma Heritage, PA-C 04/01/2018, 11:08 AM

## 2018-04-01 NOTE — Progress Notes (Signed)
Pt seen by PT today. VVS with gait and standing activity. Full detailed note to follow.   Sallyanne KusterKathy Liadan Guizar, PTA, CLT Acute Rehab Services [phone]- [phone] 04/01/18, 9:30 AM

## 2018-04-01 NOTE — Telephone Encounter (Signed)
I was asked to call the patient's daughter. She reports that she went to pick up the patient's medicines today but her Entresto required prior authorization before it could get filled. I told her I would loop in her primary team and that we would expedite this process as possible.

## 2018-04-01 NOTE — Progress Notes (Signed)
Physical Therapy Treatment Patient Details Name: [name] BolognaDeanne Robenson MRN: [id number] DOB: 08/04/1941 Today's Date: 04/01/2018    History of Present Illness Pt is a 77 y.o. 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 Heart cath scheduled 7/29. Now s/p PCI with DES placement.  PMH includes HTN, OSA, LBBB.    PT Comments    Today's skilled PT session continued to focus on overall mobility and gait with rollator. Pt with increased gait distance and improved static standing balance for ADL's, however reported too tired to attempt stairs today. VSS with session on telemetry monitor. Acute PT to continue during pt's hospital stay.    Follow Up Recommendations  Home health PT;Supervision/Assistance - 24 hour;Other (comment)(OP cardiac rehab)     Equipment Recommendations  Other (comment)(pt has rollator )    Precautions / Restrictions Precautions Precautions: Fall Restrictions Weight Bearing Restrictions: No    Balance Overall balance assessment: Needs assistance         Standing balance support: Single extremity supported;No upper extremity supported;During functional activity Standing balance-Leahy Scale: Good Standing balance comment: standing at sink to clean dentures with intermittent UE support on counter for ~5 minutes with min guard to min assist x 2 with LE's having slight buckling that pt assisted with correcting.              Cognition Arousal/Alertness: Awake/alert Behavior During Therapy: WFL for tasks assessed/performed Overall Cognitive Status: Within Functional Limits for tasks assessed               Pertinent Vitals/Pain Pain Assessment: No/denies pain     PT Goals (current goals can now be found in the care plan section) Acute Rehab PT Goals Patient Stated Goal: go home tomorrow PT Goal Formulation: With patient Time For Goal Achievement: 04/07/18 Potential to Achieve  Goals: Good Progress towards PT goals: Progressing toward goals    Frequency    Min 3X/week      PT Plan Current plan remains appropriate    AM-PAC PT "6 Clicks" Daily Activity  Outcome Measure  Difficulty turning over in bed (including adjusting bedclothes, sheets and blankets)?: None Difficulty moving from lying on back to sitting on the side of the bed? : A Little Difficulty sitting down on and standing up from a chair with arms (e.g., wheelchair, bedside commode, etc,.)?: A Little Help needed moving to and from a bed to chair (including a wheelchair)?: A Little Help needed walking in hospital room?: A Little Help needed climbing 3-5 steps with a railing? : A Little 6 Click Score: 19    End of Session Equipment Utilized During Treatment: Gait belt Activity Tolerance: Patient tolerated treatment well;No increased pain;Patient limited by fatigue Patient left: in bed;with call bell/phone within reach;with family/visitor present;with nursing/sitter in room Nurse Communication: Mobility status PT Visit Diagnosis: Unsteadiness on feet (R26.81);Other abnormalities of gait and mobility (R26.89);Muscle weakness (generalized) (M62.81)     Time: [id number] PT Time Calculation (min) (ACUTE ONLY): 32 min  Charges:  $Gait Training: 8-22 mins $Therapeutic Activity: 8-22 mins                    Sallyanne KusterKathy Kylie Simmonds, PTA, [facility] Office- [phone] 04/01/18, 12:52 PM  Sallyanne KusterBury, Zenia Guest 04/01/2018, 12:50 PM

## 2018-04-01 NOTE — Progress Notes (Signed)
Patient ID: [name] Rosales, female   DOB: 1941-03-14, 77 y.o.   MRN: [id number]    Advanced Heart Failure Rounding Note   Subjective:    03/27/18 L/RHC showed 1v CAD with high grade mRCA stenosis (otherwise non-obstructive CAD) with mild pulmonary HTN with prominent V waves suggestive MR vs significant diastolic dysfunction.   Pt underwent successful mRCA PCI with DES placement. Will need triple therapy for 4 weeks, and then drop ASA.   Seen by neurology 03/28/18 for intermittent "uncontrolled shaking/tremors". MRI brain as below.   Remains in NSR this am with rate in 80s.  Reported episode of type 1 2nd degree AVB yesterday but cannot find on telemetry.   Walked with PT yesterday, feels good today.  No dyspnea.   CVP 5, creatinine up to 1.8.   EEG 03/29/18 - Normal, awake and asleep.  CT Chest/Abd/Pelvis 03/29/18 1. No findings concerning for malignancy 2. Emphysema 3. Aortic atherosclerosis 4. 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   MR MRA Head 03/29/18 1. Negative posterior circulation aside from tortuous right PCA P1 segment. 2. Distal Right ICA 3-4 mm infundibulum versus aneurysm. Neuro-Interventional Radiology consultation is suggested to evaluate 3. ICA siphon atherosclerosis without stenosis.  MRI brain 03/28/18  1. 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 Atrophy with extensive chronic microvascular ischemia  cMRI 03/28/18 1. Normal LV size with mild LV Hypertrophy. EF 40% with septal-lateral dyssynchrony and diffuse HK. 2. Mildly dilated RV with moderately decreased function, EF 32% 3. BAE 4. No myocardial LGE.  5. Noted to be difficult study due to resp artificat  ECHO EF ~35% by echo EF 40-45% by cath   [facility] 03/27/18  Mid RCA lesion is 90% stenosed.  Prox LAD to Mid LAD lesion is 20% stenosed.  Findings: RA = 5 RV = 58/8 PA = 56/18 (34) PCW = 16 (v = 25) Fick cardiac output/index = 6.8/4.0 PVR =2.7 WU FA sat  = 98% PA sat = 77%, 79% SVC sat = 75%  Objective:   Weight Range:  Vital Signs:   Temp:  [97.4 F (36.3 C)-98.7 F (37.1 C)] 97.9 F (36.6 C) (08/03 0742) Pulse Rate:  [62-94] 91 (08/03 0742) Resp:  [13-21] 17 (08/03 0742) BP: (102-151)/(52-65) 117/60 (08/03 0742) SpO2:  [91 %-98 %] 98 % (08/03 0742) FiO2 (%):  [2 %] 2 % (08/03 0900) Weight:  [126 lb 8.7 oz (57.4 kg)] 126 lb 8.7 oz (57.4 kg) (08/03 0528) Last BM Date: 03/27/18  Weight change: Filed Weights   03/30/18 0300 03/31/18 0623 04/01/18 0528  Weight: 131 lb 9.8 oz (59.7 kg) 129 lb 3 oz (58.6 kg) 126 lb 8.7 oz (57.4 kg)    Intake/Output:   Intake/Output Summary (Last 24 hours) at 04/01/2018 0920 Last data filed at 04/01/2018 0900 Gross per 24 hour  Intake 1328.03 ml  Output 400 ml  Net 928.03 ml     Physical Exam   General: NAD, frail Neck: No JVD, no thyromegaly or thyroid nodule.  Lungs: Clear to auscultation bilaterally with normal respiratory effort. CV: Nondisplaced PMI.  Heart regular S1/S2, no S3/S4, no murmur.  No peripheral edema.  No carotid bruit.  Normal pedal pulses.  Abdomen: Soft, nontender, no hepatosplenomegaly, no distention.  Skin: Intact without lesions or rashes.  Neurologic: Alert and oriented x 3.  Psych: Normal affect. Extremities: No clubbing or cyanosis.  HEENT: Normal.   Telemetry   NSR 80s, personally reviewed.   Labs  Basic Metabolic Panel: Recent Labs  Lab 03/25/18 1847  03/27/18 0219 03/28/18 0447 03/30/18 0753 03/31/18 0500 04/01/18 0550  NA  --    < > 127* 126* 125* 126* 125*  K 3.6   < > 4.9 4.8 4.0 3.5 4.4  CL  --    < > 92* 95* 88* 91* 89*  CO2  --    < > 22 23 26 25 26   GLUCOSE  --    < > 119* 116* 111* 99 106*  BUN  --    < > 7* 11 14 21  27*  CREATININE  --    < > 0.78 0.80 1.14* 1.35* 1.87*  CALCIUM  --    < > 8.4* 8.2* 8.5* 7.4* 8.5*  MG 1.7  --   --   --   --   --   --    < > = values in this interval not displayed.    Liver Function Tests: No  results for input(s): AST, ALT, ALKPHOS, BILITOT, PROT, ALBUMIN in the last 168 hours. No results for input(s): LIPASE, AMYLASE in the last 168 hours. No results for input(s): AMMONIA in the last 168 hours.  CBC: Recent Labs  Lab 03/28/18 0447 03/29/18 0237 03/30/18 0445 03/31/18 0500 04/01/18 0550  WBC 4.5 3.9* 3.2* 3.1* 3.3*  HGB 13.2 12.3 13.6 11.6* 13.3  HCT 38.9 36.9 39.4 33.9* 39.3  MCV 98.2 98.9 96.6 96.9 97.5  PLT 192 183 211 183 219    Cardiac Enzymes: No results for input(s): CKTOTAL, CKMB, CKMBINDEX, TROPONINI in the last 168 hours.  BNP: BNP (last 3 results) Recent Labs    03/24/18 1457  BNP 2,425.8*    ProBNP (last 3 results) No results for input(s): PROBNP in the last 8760 hours.    Other results:  Imaging: No results found.   Medications:     Scheduled Medications: . amiodarone  200 mg Oral BID  . amLODipine  10 mg Oral Daily  . apixaban  5 mg Oral BID  . aspirin  81 mg Oral Daily  . atorvastatin  80 mg Oral q1800  . clopidogrel  75 mg Oral Q breakfast  . hydrALAZINE  50 mg Oral Q8H  . hydrocortisone cream   Topical BID  . phenytoin  100 mg Oral BID  . phenytoin  200 mg Oral Daily  . sacubitril-valsartan  1 tablet Oral BID  . sodium chloride flush  10-40 mL Intracatheter Q12H  . sodium chloride flush  3 mL Intravenous Q12H  . spironolactone  25 mg Oral Daily  . vitamin B-12  2,000 mcg Oral Daily    Infusions: . sodium chloride      PRN Medications: sodium chloride, acetaminophen, hydrALAZINE, nitroGLYCERIN, ondansetron (ZOFRAN) IV, polyethylene glycol, sodium chloride flush, sodium chloride flush, traMADol   Assessment:   77 y/o woman admitted 7/26 with acute on chronic systolic HF  Plan/Discussion:    1. 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 Differential also includes  ischemic CM, infiltrative CM (amyloid) and CTD-related PAH/HF. Admitted with massive volume overload and NYHA IIIB-IV symptoms.  cMRI 03/28/18 with LVEF 40% and no LGE. Full report as above. Echo 03/25/18 EF 35%.  Weight down > 20 lbs. CVP now 5 with rise in creatinine to 1.8.  On exam, she is not volume overloaded. SBP 110s-120s.  - Hold  torsemide, will restart on Monday at 40 mg daily.  - Hold am Entresto and restart lower dose 49/51 bid.   - Continue carvedilol 3.125 bid - Continue spiro 25 mg daily - Continue amlodipine 10 mg daily for now.  - Continue hydralazine 50 tid  2. Severe HTN: Improved. [id number] today. No longer requiring clonidine. - As above, lowering Entresto with lower BP and rise in creatinine.  3. CAD: 03/27/18 L/RHC showed 1v CAD with high grade mRCA stenosis (otherwise non-obstructive CAD) with mild pulmonary HTN with prominent V waves suggestive MR vs significant diastolic dysfunction. No chest pain.  - Continue ASA, plavix, and Eliquis.    - Will drop ASA after 1 month - Will need plavix for at least 6 months. 4. PAF, new onset: NSR this am.  - She is now on Eliquis.  - Continue amiodarone 200 mg bid x 1 week then 200 mg daily.  5. Facial rash: Reviewed with Dermatology. 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 + Ro and La antibodies as well as below.  - Continue Hydrocortisone 2.5% to face and neck - No change to current plan.   6. LBBB: Consider CRT depending on LV EF in future.  7. OSA: Continue CPAP qhs. No change. 8. Migraines: Resolved today.  9. Hyponatremia: Na 125 this am. Has been a chronic problem for her.  - po fluid restrict.  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 DDx is broad but includes phenytoin-induced chorea and hyponatremia. MRI brain 03/28/18 with acute occipital infarct and chronic microvascular changes in setting of atrial fibrillation. Heavy metals negative. IgG + at > 1800. (Normal [phone] mg/dL). CT Chest/Abdomen/Pelvis 03/29/18 with no evidence  for malignancy.  - No change.  11. Rheumatological: ANA +, + SSA (Ro) and SSB (La) Antibodies, IgG.  - Will need follow up with Rheumatology on discharge. No change.  12. ICA aneurysm: Incidental finding. Felt to be small.  - Will need repeat CTA in February 2020  13. AKI: Cr 1.8 today from baseline 0.6-0.8 in setting of multiple dose of contrast over past few days. CVP 5.  - Hold torsemide until Monday, can start 40 mg daily at that time.  - Decrease Entresto to 49/51 bid and hold am dose.  - Repeat BMET Monday, fax to CHF office.   Plan for home today.  Will need followup in CHF clinic.  She will need BMET on Monday at PCP's office and faxed to CHF clinic. Needs home health RN/home PT. Outpatient Cardiac Rehab referral in [place].  Meds:  Amiodarone 200 bid x 1 week then 200 mg daily  Eliquis 5 bid ASA 81 mg daily (stop after 30 days) Plavix 75 mg daily Spironolactone 25 mg daily Amlodipine 10 mg day Phenytoin ER 200/100/100 Torsemide 40 daily starting on Monday.  Hydralazine 50 tid Entresto 49/51 bid Hydrocortisone cream 1% for rash on chest  Marca Anconaalton Lakendra Helling, MD  9:20 AM

## 2018-04-01 NOTE — Telephone Encounter (Signed)
I was notified to call the patient's daughter. She tried to fill the patient's Entresto prescription and was told that it needed prior authorization. I told the patent's daughter that I would let her primary team know and that we would try to expedite this process as we are able.

## 2018-04-02 ENCOUNTER — Other Ambulatory Visit: Payer: Self-pay | Admitting: Cardiology

## 2018-04-04 LAB — ANTIPHOSPHOLIPID SYNDROME EVAL, BLD
Anticardiolipin IgM: <9 MPL U/mL (ref 0–12)
DRVVT: 61.8 s — ABNORMAL HIGH (ref 0.0–47.0)
PHOSPHATYDALSERINE, IGA: 4 {APS'U} (ref 0–20)
PTT Lupus Anticoagulant: 45.5 s (ref 0.0–51.9)
Phosphatydalserine, IgG: 4 GPS IgG (ref 0–11)
Phosphatydalserine, IgM: 8 MPS IgM (ref 0–25)

## 2018-04-04 LAB — DRVVT MIX: dRVVT Mix: 44.1 s (ref 0.0–47.0)

## 2018-04-05 LAB — MISC LABCORP TEST (SEND OUT): Labcorp test code: 117085

## 2018-04-07 ENCOUNTER — Encounter (HOSPITAL_COMMUNITY): Payer: Self-pay | Admitting: Internal Medicine

## 2018-04-07 ENCOUNTER — Telehealth (HOSPITAL_COMMUNITY): Payer: Self-pay | Admitting: Pharmacist

## 2018-04-07 ENCOUNTER — Ambulatory Visit (HOSPITAL_BASED_OUTPATIENT_CLINIC_OR_DEPARTMENT_OTHER)
Admission: RE | Admit: 2018-04-07 | Discharge: 2018-04-07 | Disposition: A | Discharge status: Home / Self Care | Payer: Medicare PPO | Source: Ambulatory Visit | Attending: Internal Medicine | Admitting: Internal Medicine

## 2018-04-07 ENCOUNTER — Other Ambulatory Visit: Payer: Self-pay | Admitting: Internal Medicine

## 2018-04-07 ENCOUNTER — Ambulatory Visit (HOSPITAL_COMMUNITY)
Admission: RE | Admit: 2018-04-07 | Discharge: 2018-04-07 | Disposition: A | Discharge status: Home / Self Care | Payer: Medicare PPO | Source: Ambulatory Visit | Attending: Internal Medicine | Admitting: Internal Medicine

## 2018-04-07 VITALS — BP 120/74 | HR 104 | Wt 126.0 lb

## 2018-04-07 DIAGNOSIS — Z7901 Long term (current) use of anticoagulants: Secondary | ICD-10-CM | POA: Insufficient documentation

## 2018-04-07 DIAGNOSIS — G43901 Migraine, unspecified, not intractable, with status migrainosus: Secondary | ICD-10-CM

## 2018-04-07 DIAGNOSIS — Z7902 Long term (current) use of antithrombotics/antiplatelets: Secondary | ICD-10-CM | POA: Diagnosis not present

## 2018-04-07 DIAGNOSIS — I110 Hypertensive heart disease with heart failure: Secondary | ICD-10-CM | POA: Insufficient documentation

## 2018-04-07 DIAGNOSIS — Z87891 Personal history of nicotine dependence: Secondary | ICD-10-CM | POA: Insufficient documentation

## 2018-04-07 DIAGNOSIS — G43909 Migraine, unspecified, not intractable, without status migrainosus: Secondary | ICD-10-CM | POA: Insufficient documentation

## 2018-04-07 DIAGNOSIS — E871 Hypo-osmolality and hyponatremia: Secondary | ICD-10-CM | POA: Diagnosis not present

## 2018-04-07 DIAGNOSIS — I480 Paroxysmal atrial fibrillation: Secondary | ICD-10-CM | POA: Diagnosis not present

## 2018-04-07 DIAGNOSIS — I671 Cerebral aneurysm, nonruptured: Secondary | ICD-10-CM | POA: Insufficient documentation

## 2018-04-07 DIAGNOSIS — I5023 Acute on chronic systolic (congestive) heart failure: Secondary | ICD-10-CM | POA: Insufficient documentation

## 2018-04-07 DIAGNOSIS — I2510 Atherosclerotic heart disease of native coronary artery without angina pectoris: Secondary | ICD-10-CM | POA: Insufficient documentation

## 2018-04-07 DIAGNOSIS — R251 Tremor, unspecified: Secondary | ICD-10-CM | POA: Diagnosis not present

## 2018-04-07 DIAGNOSIS — Z79899 Other long term (current) drug therapy: Secondary | ICD-10-CM | POA: Insufficient documentation

## 2018-04-07 DIAGNOSIS — I729 Aneurysm of unspecified site: Secondary | ICD-10-CM

## 2018-04-07 DIAGNOSIS — I5022 Chronic systolic (congestive) heart failure: Secondary | ICD-10-CM | POA: Diagnosis not present

## 2018-04-07 DIAGNOSIS — I721 Aneurysm of artery of upper extremity: Secondary | ICD-10-CM | POA: Diagnosis not present

## 2018-04-07 DIAGNOSIS — Z8249 Family history of ischemic heart disease and other diseases of the circulatory system: Secondary | ICD-10-CM | POA: Diagnosis not present

## 2018-04-07 DIAGNOSIS — N179 Acute kidney failure, unspecified: Secondary | ICD-10-CM | POA: Diagnosis not present

## 2018-04-07 DIAGNOSIS — G4733 Obstructive sleep apnea (adult) (pediatric): Secondary | ICD-10-CM | POA: Diagnosis not present

## 2018-04-07 DIAGNOSIS — Z7982 Long term (current) use of aspirin: Secondary | ICD-10-CM | POA: Insufficient documentation

## 2018-04-07 DIAGNOSIS — I447 Left bundle-branch block, unspecified: Secondary | ICD-10-CM | POA: Insufficient documentation

## 2018-04-07 DIAGNOSIS — R21 Rash and other nonspecific skin eruption: Secondary | ICD-10-CM | POA: Diagnosis not present

## 2018-04-07 LAB — COMPREHENSIVE METABOLIC PANEL
ALK PHOS: 142 U/L — AB (ref 38–126)
ALT: 21 U/L (ref 0–44)
AST: 45 U/L — ABNORMAL HIGH (ref 15–41)
Albumin: 3.6 g/dL (ref 3.5–5.0)
Anion gap: 10 (ref 5–15)
BILIRUBIN TOTAL: 0.8 mg/dL (ref 0.3–1.2)
BUN: 19 mg/dL (ref 8–23)
CALCIUM: 8.7 mg/dL — AB (ref 8.9–10.3)
CO2: 26 mmol/L (ref 22–32)
CREATININE: 1.08 mg/dL — AB (ref 0.44–1.00)
Chloride: 91 mmol/L — ABNORMAL LOW (ref 98–111)
GFR calc non Af Amer: 48 mL/min — ABNORMAL LOW (ref >60)
GFR, EST AFRICAN AMERICAN: 56 mL/min — AB (ref >60)
Glucose, Bld: 99 mg/dL (ref 70–99)
Potassium: 3.5 mmol/L (ref 3.5–5.1)
SODIUM: 127 mmol/L — AB (ref 135–145)
TOTAL PROTEIN: 7.5 g/dL (ref 6.5–8.1)

## 2018-04-07 LAB — CBC
HCT: 37.9 % (ref 36.0–46.0)
Hemoglobin: 12.4 g/dL (ref 12.0–15.0)
MCH: 32.8 pg (ref 26.0–34.0)
MCHC: 32.7 g/dL (ref 30.0–36.0)
MCV: 100.3 fL — ABNORMAL HIGH (ref 78.0–100.0)
PLATELETS: 289 10*3/uL (ref 150–400)
RBC: 3.78 MIL/uL — ABNORMAL LOW (ref 3.87–5.11)
RDW: 14.3 % (ref 11.5–15.5)
WBC: 3.8 10*3/uL — ABNORMAL LOW (ref 4.0–10.5)

## 2018-04-07 LAB — SEDIMENTATION RATE: Sed Rate: 29 mm/hr — ABNORMAL HIGH (ref 0–22)

## 2018-04-07 LAB — CK: CK TOTAL: 44 U/L (ref 38–234)

## 2018-04-07 MED ORDER — HYDRALAZINE HCL 25 MG PO TABS: 25.0000 mg | ORAL_TABLET | Freq: Three times a day (TID) | ORAL | 11 refills | Status: AC

## 2018-04-07 MED ORDER — PREDNISONE 10 MG PO TABS: ORAL_TABLET | ORAL | 0 refills | Status: DC

## 2018-04-07 MED ORDER — TORSEMIDE 20 MG PO TABS: 40.0000 mg | ORAL_TABLET | Freq: Every day | ORAL | 11 refills | Status: DC | PRN

## 2018-04-07 MED ORDER — FENTANYL CITRATE (PF) 100 MCG/2ML IJ SOLN
50.0000 ug | Freq: Once | INTRAMUSCULAR | Status: AC
  Administered 2018-04-07: 25 ug via INTRAVENOUS
  Filled 2018-04-07: qty 1

## 2018-04-07 MED ORDER — TRAMADOL HCL 50 MG PO TABS: 50.0000 mg | ORAL_TABLET | Freq: Three times a day (TID) | ORAL | 0 refills | Status: AC | PRN

## 2018-04-07 NOTE — Progress Notes (Signed)
Patient seen in CHF clinic today for routine post hospital follow up. Patient sent to vascular study for RUE arterial study for pseudo post cath. Dr. Gala RomneyBensimhon, cath RN, and vascular tech present for compression. Compression held for 2 hrs with repeat studies without significant resolve in pseudo. Patient given 25 mcg fentanyl for pain control via 24 g x 1 in LAC. Herbert SetaHeather Schub present as Training and development officerverifier for 25 mcg drawn to give to patient. 75 cc remaining fentanyl wasted with Meredith StaggersHeather Schub RN present. Compression wrap placed on patient's R wrist before DC from clinic and scheduled Monday for repeat arterial study and exam with Dr. Gala RomneyBensimhon. Daughter Elnita MaxwellCheryl educated on distal pulses and cap refills and temp and advised to call CHF clinic or reprt to ER for changes indicating poor perfusion.  Ave FilterBradley, Megan Genevea, RN

## 2018-04-07 NOTE — Progress Notes (Addendum)
*  PRELIMINARY RESULTS* Vascular Ultrasound Limited Right Upper Extremity Arterial Duplex has been completed.   There is evidence of an area of vascularity measuring 4.6 x 4.566mm extending from the right distal radial artery with a heterogenous area anterior to it. 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 After over an hour of compression with Dr. Gala RomneyBensimhon the neck remains, measuring 2.8 x 2.4cm.   04/07/2018 4:11 PM [name] FeyMichelle Zelda Rosales, MHA, RVT, RDCS, RDMS Farrel DemarkJill Eunice, BS, RVT, RDMS

## 2018-04-07 NOTE — Telephone Encounter (Signed)
Entresto PA approved [facility] through 04/06/20.   Tyler DeisErika K. Bonnye FavaNicolsen, PharmD, BCPS, CPP Clinical Pharmacist Phone: [phone] 04/07/2018 12:31 PM

## 2018-04-07 NOTE — Progress Notes (Signed)
Called by MD approx 1500 regarding patient access site post cath done last week. 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 Arrived to room during treatment. Held pressure on site for 25 minutes. TR band placed to Site with 6cc. Daughter instructed by MD to remove TR band by 1900. Distal flow to hand was verified by ultrasound.

## 2018-04-07 NOTE — Patient Instructions (Addendum)
Routine lab work today. Will notify you of abnormal results, otherwise no news is good news!  START Prednisone: Take 40 mg (4 tabs) for 2 days, then 30 mg (3 tabs) for 2 days, then 20 mg (2 tabs) for 2 days, then 10 mg (1 tab) for 2 days.  DECREASE Torsemide to 1 tablet once daily AS NEEDED for weight greater than 128 lbs.  DECREASE Hydralazine to 25 mg three times daily. Can cut current 50 mg tablets in half (take 1/2 tablet three times daily). New Rx sent to pharmacy for 25 mg tablets (Take 1 tab three times daily).  Tramadol 50 mg tablet once three times daily AS NEEDED for moderate pain.  Will refer you to [facility] for [facility]. Address: [address], [place], [place] [address]  Phone: [phone] Their office will call directly to schedule and give further instructions.  Follow up with Dr. Gala RomneyBensimhon in 2 weeks.  _________________________________________________________________________________ [facility] Ridge[facility] Code: 1500  Take all medication as prescribed the day of your appointment. Bring all medications with you to your appointment.  Do the following things EVERYDAY: 1) Weigh yourself in the morning before breakfast. Write it down and keep it in a log. 2) Take your medicines as prescribed 3) Eat low salt foods-Limit salt (sodium) to 2000 mg per day.  4) Stay as active as you can everyday 5) Limit all fluids for the day to less than 2 liters

## 2018-04-07 NOTE — Progress Notes (Signed)
.   Advanced Heart Failure Clinic Note     Primary Cardiologist: None  HPI:  [name] Rosales is a 77 y.o. female  (mother of Cory Munch) with h/o HTN and recently diagnosed systolic HF presents as a new patient for further evaluation of recent-onset systolic HF.   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 Has long h/o HTN. Was fine until April of this year when she developed severe ankle swelling followed by exertional dyspnea.  12/2017 May she developed respiratory failure with orthopnea and PND.  Went to urgent care and transferred to the [facility]. Limited notes in Care Everywhere which we reviewed. On arrival SBP was > 200. Echo as below showed EF ~40% with 2-3+ MR. Inferior HK. She was diuresed about 13 pounds. Also found to have OSA with AHI 23 started on CPAP. Renal artery u/s without evidence of RAS. Meds adjusted to get BP down and was started on clonidine 0.3 patch  Admitted from HF clinic 03/24/18 with worsening functional status and marked volume overload. Diuresed with IV lasix. Echo showed EF 30-35% as below. Taken for [facility] as below which showed 90% RCA stenosis. Treated with PCI and DES by Dr. Tamala Julian. Developed Afib post procedure leading to initiation of Eliquis 5 mg BID and amiodarone. She was in and out of Afib but ultimately converted to NSR on amiodarone.  Hospital course additionally complicated by uncontrollable shaking/tremors which led to Neuro consult. MRI as below. Work up further revealed positive ASA and SSA (Ro) and SSB (La) prompting Rheumoatology referral as outpatient. Discharge weight 126 lbs, though creatinine had trended up to 1.87. She was in NSR when she left on 04/01/18.  She presents today for post hospital follow up. Feeling poorly. Has continuous migraine. Very weak. No energy. No edema, orthopnea or PND. Has held diuretics for 2 days due to dizziness. Weight 124 pounds.   EKG today shows Afib 97 bpm.   On oxygen at night with CPAP. No h/o tobacco  use. No DVT/PE  Echo 03/25/18 LVEF of 30-35%, severe HK with incoordinate, septal motion, grade 2 DD, Mild/Mod MR, Severe BAE, Moderate TR.   PCI 03/27/18 - Successful mid RCA PCI reducing greater than 95% stenosis with TIMI-3 flow to to 0% with TIMI grade III flow.  Final post dilatation pressure was 14 atm using the device balloon.  R/LHC 03/27/18  Mid RCA lesion is 90% stenosed.  Prox LAD to Mid LAD lesion is 20% stenosed. Findings: RA = 5 RV = 58/8 PA = 56/18 (34) PCW = 16 (v = 25) Fick cardiac output/index = 6.8/4.0 PVR =2.7 WU FA sat = 98% PA sat = 77%, 79% SVC sat = 75%   Studies: 12/2017 [facility]) The left ventricular ejection fraction is moderately reduced (~40%) with inferior HK  Flattened septum is consistent with right ventricular pressure overload.  There is normal left ventricular wall thickness.  The aortic valve is trileaflet with thin, pliable leaflets that move  normally.  There is moderate to moderately severe (2-3+) mitral regurgitation.  Septal motion is consistent with conduction abnormality.  The left atrium is severely dilated.  The right atrium is severely dilated.  There is moderate (2+) tricuspid regurgitation.  Right ventricular systolic pressure is elevated between 40-41m Hg,  consistent with moderate pulmonary hypertension.  There is a small size pericardial effusion.   Review of systems complete and found to be negative unless listed in HPI.    PMHx: 1. HTN 2. Systolic HF  Current Outpatient  Medications  Medication Sig Dispense Refill  . acetaminophen (TYLENOL) 500 MG tablet Take 500 mg by mouth daily as needed.    Marland Kitchen amiodarone (PACERONE) 200 MG tablet Take 1 tablet (200 mg total) by mouth 2 (two) times daily. On 04/08/2018, reduce to 1 tablet (232m) once daily. 60 tablet 5  . apixaban (ELIQUIS) 5 MG TABS tablet Take 1 tablet (5 mg total) by mouth 2 (two) times daily. 60 tablet 5  . aspirin 81 MG chewable tablet Chew 1 tablet (81 mg  total) by mouth daily. STOP TAKING on 04/27/2018.    .Marland Kitchenatorvastatin (LIPITOR) 80 MG tablet Take 1 tablet (80 mg total) by mouth daily at 6 PM. 30 tablet 5  . calcium carbonate (TUMS - DOSED IN MG ELEMENTAL CALCIUM) 500 MG chewable tablet Chew 2 tablets by mouth daily.    . clopidogrel (PLAVIX) 75 MG tablet Take 1 tablet (75 mg total) by mouth daily with breakfast. 30 tablet 5  . hydrALAZINE (APRESOLINE) 50 MG tablet Take 1 tablet (50 mg total) by mouth every 8 (eight) hours. 90 tablet 5  . hydrocortisone cream 1 % Apply topically 2 (two) times daily. 30 g 0  . multivitamin-lutein (OCUVITE-LUTEIN) CAPS capsule Take 1 capsule by mouth daily.    . nitroGLYCERIN (NITROSTAT) 0.4 MG SL tablet Place 1 tablet (0.4 mg total) under the tongue every 5 (five) minutes as needed for chest pain. 25 tablet 2  . NON FORMULARY CPAP    . phenytoin (DILANTIN) 100 MG ER capsule Take 1-2 capsules (100-200 mg total) by mouth See admin instructions. Take 200 mg every morning  Take 100 mg midday (12 pm) Take 100 mg at bedtime    . sacubitril-valsartan (ENTRESTO) 49-51 MG Take 1 tablet by mouth 2 (two) times daily. 60 tablet 5  . spironolactone (ALDACTONE) 25 MG tablet Take 1 tablet (25 mg total) by mouth daily.    .Marland Kitchentorsemide (DEMADEX) 20 MG tablet Take 2 tablets (40 mg total) by mouth daily. 60 tablet 5   No current facility-administered medications for this encounter.     No Known Allergies    Social History   Socioeconomic History  . Marital status: Single    Spouse name: Not on file  . Number of children: Not on file  . Years of education: Not on file  . Highest education level: Not on file  Occupational History  . Not on file  Social Needs  . Financial resource strain: Not on file  . Food insecurity:    Worry: Not on file    Inability: Not on file  . Transportation needs:    Medical: Not on file    Non-medical: Not on file  Tobacco Use  . Smoking status: Former SResearch scientist (life sciences) . Smokeless tobacco: Never  Used  Substance and Sexual Activity  . Alcohol use: Not on file  . Drug use: Not on file  . Sexual activity: Not on file  Lifestyle  . Physical activity:    Days per week: Not on file    Minutes per session: Not on file  . Stress: Not on file  Relationships  . Social connections:    Talks on phone: Not on file    Gets together: Not on file    Attends religious service: Not on file    Active member of club or organization: Not on file    Attends meetings of clubs or organizations: Not on file    Relationship status: Not on file  .  Intimate partner violence:    Fear of current or ex partner: Not on file    Emotionally abused: Not on file    Physically abused: Not on file    Forced sexual activity: Not on file  Other Topics Concern  . Not on file  Social History Narrative  . Not on file    FHx: Father died from MVA Mother had HTN. Died at 53 Grandmother had HF No family h/o premature CAD  Vitals:   04/07/18 1133  BP: 120/74  Pulse: (!) 104  SpO2: 96%  Weight: 57.2 kg   Wt Readings from Last 3 Encounters:  04/07/18 57.2 kg  04/01/18 57.4 kg  03/24/18 69.4 kg    PHYSICAL EXAM: General:Elderly and chronically ill appearing. NAD HEENT: Normal. Patch, crusted rash on face and neck.  Neck: Supple. JVP 6-7 Carotids 2+ bilat; no bruits. No thyromegaly or nodule noted. Cor: PMI nondisplaced. IRR, 2/6 TR Lungs: CTAB, normal effort. Abdomen: soft, nontender, nondistended. No hepatosplenomegaly. No bruits or masses. Good bowel sounds. Extremities: no cyanosis, clubbing, rash, edema Prominent pulsatile lump over R radial artery cath site Neuro: alert & oriented x 3, cranial nerves grossly intact. moves all 4 extremities w/o difficulty. Affect pleasant   ECG today shows Afib 97 bpm  ASSESSMENT & PLAN: 1. 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 Differential also includes ischemic CM, infiltrative CM (amyloid) and CTD-related PAH/HF. Admitted with massive volume overload and NYHA IIIB-IV symptoms.  cMRI 03/28/18 with LVEF 40% and no LGE. Echo 03/25/18 EF 35%.  Weight down > 20 lbs.  - NYHA II-III symptoms appear worse than explained by HF alone. Concern over another systemic process. (malignanacy or CTD). Scans in hospital negative for malignancy. ? Possible lupus (see discussion below) - Volume status ok  - Decrease torsemide to 20 mg daily PRN only until volume status stabilized - Continue Entresto 49/51 bid.   - Continue carvedilol 3.125 bid - Continue spiro 25 mg daily - Decrease hydralazine to 25 tid tid  - Reinforced fluid restriction to < 2 L daily, sodium restriction to less than 2000 mg daily, and the importance of daily weights.   2. Severe HTN:  - BP on low side today. - Changes to meds as above  3. CAD:  - 03/27/18 L/RHC showed 1v CAD with high grade mRCA stenosis (otherwise non-obstructive CAD) with mild pulmonary HTN with prominent V waves suggestive MR vs significant diastolic dysfunction.  - No s/s ischemia - Continue ASA, plavix, and Eliquis.    - Will drop ASA after 1 month (04/27/18) - Will need plavix for at least 6 months. 4. PAF, new onset:  - She is back in AF today. Rate slightly elevated.  - She is now on Eliquis.  - Continue amio 200 bid 5. Facial rash/?possible lups:  - Reviewed with Dermatology during recent admission. 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 + Ro and La antibodies suggestive of possible Sjogrens - I discussed with Dr. Amil Amen in Rheum and we will try steroid taper for 1 week. He will see her next week - Send ANCA, CK, aldo;ase to check for dermatomyositis  - Continue Hydrocortisone 2.5% to face and neck 6. LBBB:  - Consider  CRT depending on LV EF in future. No change.  7. OSA:  - Continue CPAP qhs. No change.  8. Migraines:  - Persistent. Continue phenyotin. Refer to Neurology  9. Hyponatremia:  - Chronic. Na 125 at discharge. 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 DDx is broad but includes phenytoin-induced chorea and hyponatremia. MRI brain 03/28/18 with acute occipital infarct and chronic microvascular changes in setting of atrial fibrillation. Heavy metals negative. IgG + at > 1800. (Normal [phone] mg/dL). CT Chest/Abdomen/Pelvis 03/29/18 with no evidence for malignancy. ? Worsened by fatigue and anxiety - Wil f/u with Neurology 12. ICA aneurysm: - Incidental finding. Felt to be small.  - Will need repeat CTA in February 2020. No change.  13. AKI:  Creatinine 1.8 on d/c. 1.08 today 14. 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 Small PSA with only neck remaining. - we held pressure for 80 minutes and were able to compress some but not obliterate - I reviewed images with Dr. Donnetta Hutching. May need operative repair but would need to be off Eliquis.   Total time spent 60 minutes. Over half that time spent discussing above.   Glori Bickers, MD  9:01 AM

## 2018-04-08 LAB — ALDOLASE: Aldolase: 5.8 U/L (ref 3.3–10.3)

## 2018-04-10 ENCOUNTER — Ambulatory Visit (HOSPITAL_COMMUNITY): Admission: RE | Admit: 2018-04-10 | Payer: Medicare PPO | Source: Ambulatory Visit

## 2018-04-11 LAB — ANCA TITERS
Atypical P-ANCA titer: <1:20 {titer}
P-ANCA: <1:20 {titer}

## 2018-04-13 ENCOUNTER — Ambulatory Visit: Payer: Medicare PPO | Admitting: Neurology

## 2018-04-13 NOTE — Progress Notes (Deleted)
NEUROLOGY CONSULTATION NOTE  [name] Rosales MRN: [id number] DOB: 11-30-1940  Referring provider: Arvilla Meresaniel Bensimhon, MD Primary care provider: No PCP  Reason for consult:  migraines  HISTORY OF PRESENT ILLNESS: [name] BolognaDeanne Rosales is a 77 year old ***-handed female with systolic heart failure, CAD, paroxysmal atrial fibrillation and hypertension who presents for migraines and myoclonic tremors.  History supplemented by referring provider's note and hospital records.  [facility] for right/left heart catheterization coronary angiogram with diuresis.  During her hospitalization, she demonstrated abnormal shivering followed by jerking movements.  She has an intermittent history of this since ***.  She exhibited similar movements in May when she was admitted at [facility] for CHF exacerbation and it was thought to be secondary to diuresis.  Patient reports feeling cold during the episodes.  Neurology was consulted and reported observing low to medium amplitude choreiform movements involving torso, shoulders, neck and *** extremities.  MRI of brain *** personally reviewed demonstrated punctate acute cortical ischemic infarct in the left occipital lobe.  MRA of head showed tortuous right PCA P1 segment, ICA siphon atherosclerosis without stenosis, and distal right ICA 3-654mm infundibulum vs aneurysm.  Carotid ultrasound showed mild atherosclerosis but no hemodynamically significant stenosis.  EEG was normal.  She also had a rash ***.  Labs showed positive ANA, positive SSA/SSB antibodies, and elevated serum IgG, suggestive of SLE.  B12 was 287.  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  Etiology of choreiform movements unclear but differential included hyponatremia (dropped to 126) or possibly side effect of Phenytoin.  Phenytoin was decreased to 50mg  three times daily from 200mg /100mg /100mg .  Onset:  *** Location:  *** Quality:   *** Intensity:  ***.  *** denies new headache, thunderclap headache or severe headache that wakes *** from sleep. Aura:  *** Prodrome:  *** Postdrome:  *** Associated symptoms:  ***.  *** denies associated unilateral numbness or weakness. Duration:  *** Frequency:  *** Frequency of abortive medication: *** Triggers:  *** Exacerbating factors:  *** Relieving factors:  *** Activity:  ***  Current NSAIDS:  ASA 81mg  Current analgesics:  Tylenol, tramadol Current triptans:  *** Current ergotamine:  *** Current anti-emetic:  *** Current muscle relaxants:  *** Current anti-anxiolytic:  hydralazine Current sleep aide:  *** Current Antihypertensive medications:  Entresto, spironolactone Current Antidepressant medications:  *** Current Anticonvulsant medications:  Phenytoin 200mg /100mg /100mg  Current anti-CGRP:  *** Current Vitamins/Herbal/Supplements:  MVI Current Antihistamines/Decongestants:  *** Other therapy:  *** Other medication:  ***  Past NSAIDS:  *** Past analgesics:  *** Past abortive triptans:  *** Past abortive ergotamine:  *** Past muscle relaxants:  *** Past anti-emetic:  *** Past antihypertensive medications:  Amlodipine, clonidine, Toprol XL Past antidepressant medications:  *** Past anticonvulsant medications:  *** Past anti-CGRP:  *** Past vitamins/Herbal/Supplements:  *** Past antihistamines/decongestants:  *** Other past therapies:  ***  Caffeine:  *** Alcohol:  *** Smoker:  *** Diet:  *** Exercise:  *** Depression:  ***; Anxiety:  *** Other pain:  *** Sleep hygiene:  *** Family history of headache:  ***  04/07/18 LABS:  Sed Rate 29; CMP with Na 127, K 3.5, Cl 91, CO2 26, glucose 99, BUN 19, Cr 1.08, t bili 0.8, ALP 142, AST 45, ALT 21; CBC with WBC 3.8, HGB 12.4, HCT 37.9, PLT 289; CK 44, aldolase 5.8, c-ANCA <1:20, p-ANCA 1:80  PAST MEDICAL HISTORY: Past Medical History:  Diagnosis Date  . Choreiform movements   . Fluid overload   .  Heart  failure (HCC)   . Hypertension   . Lower extremity pain     PAST SURGICAL HISTORY: Past Surgical History:  Procedure Laterality Date  . CORONARY STENT INTERVENTION N/A 03/27/2018   Procedure: CORONARY STENT INTERVENTION;  Surgeon: Lyn Records, MD;  Location[facility] INVASIVE CV LAB;  Service: Cardiovascular;  Laterality: N/A;  . RIGHT/LEFT HEART CATH AND CORONARY ANGIOGRAPHY N/A 03/27/2018   Procedure: RIGHT/LEFT HEART CATH AND CORONARY ANGIOGRAPHY;  Surgeon: Dolores Patty, MD;  Location: [facility] INVASIVE CV LAB;  Service: Cardiovascular;  Laterality: N/A;    MEDICATIONS: Current Outpatient Medications on File Prior to Visit  Medication Sig Dispense Refill  . acetaminophen (TYLENOL) 500 MG tablet Take 500 mg by mouth daily as needed.    Marland Kitchen amiodarone (PACERONE) 200 MG tablet Take 1 tablet (200 mg total) by mouth 2 (two) times daily. On 04/08/2018, reduce to 1 tablet (200mg ) once daily. 60 tablet 5  . apixaban (ELIQUIS) 5 MG TABS tablet Take 1 tablet (5 mg total) by mouth 2 (two) times daily. 60 tablet 5  . aspirin 81 MG chewable tablet Chew 1 tablet (81 mg total) by mouth daily. STOP TAKING on 04/27/2018.    Marland Kitchen atorvastatin (LIPITOR) 80 MG tablet Take 1 tablet (80 mg total) by mouth daily at 6 PM. 30 tablet 5  . calcium carbonate (TUMS - DOSED IN MG ELEMENTAL CALCIUM) 500 MG chewable tablet Chew 2 tablets by mouth daily.    . clopidogrel (PLAVIX) 75 MG tablet Take 1 tablet (75 mg total) by mouth daily with breakfast. 30 tablet 5  . hydrALAZINE (APRESOLINE) 25 MG tablet Take 1 tablet (25 mg total) by mouth every 8 (eight) hours. 90 tablet 11  . hydrocortisone cream 1 % Apply topically 2 (two) times daily. 30 g 0  . multivitamin-lutein (OCUVITE-LUTEIN) CAPS capsule Take 1 capsule by mouth daily.    . nitroGLYCERIN (NITROSTAT) 0.4 MG SL tablet Place 1 tablet (0.4 mg total) under the tongue every 5 (five) minutes as needed for chest pain. 25 tablet 2  . NON FORMULARY CPAP    . phenytoin (DILANTIN)  100 MG ER capsule Take 1-2 capsules (100-200 mg total) by mouth See admin instructions. Take 200 mg every morning  Take 100 mg midday (12 pm) Take 100 mg at bedtime    . predniSONE (DELTASONE) 10 MG tablet Take 40 mg (4 tabs) for 2 days, then 30 mg (3 tabs) for 2 days, then 20 mg (2 tabs) for 2 days, then 10 mg (1 tab) for 2 days. 20 tablet 0  . sacubitril-valsartan (ENTRESTO) 49-51 MG Take 1 tablet by mouth 2 (two) times daily. 60 tablet 5  . spironolactone (ALDACTONE) 25 MG tablet Take 1 tablet (25 mg total) by mouth daily.    Marland Kitchen torsemide (DEMADEX) 20 MG tablet Take 2 tablets (40 mg total) by mouth daily as needed (For weight greater than 128 lbs). 30 tablet 11  . traMADol (ULTRAM) 50 MG tablet Take 1 tablet (50 mg total) by mouth 3 (three) times daily as needed. 30 tablet 0   No current facility-administered medications on file prior to visit.     ALLERGIES: No Known Allergies  FAMILY HISTORY: No family history on file. ***.  SOCIAL HISTORY: Social History   Socioeconomic History  . Marital status: Single    Spouse name: Not on file  . Number of children: Not on file  . Years of education: Not on file  . Highest education level: Not on  file  Occupational History  . Not on file  Social Needs  . Financial resource strain: Not on file  . Food insecurity:    Worry: Not on file    Inability: Not on file  . Transportation needs:    Medical: Not on file    Non-medical: Not on file  Tobacco Use  . Smoking status: Former Games developermoker  . Smokeless tobacco: Never Used  Substance and Sexual Activity  . Alcohol use: Not on file  . Drug use: Not on file  . Sexual activity: Not on file  Lifestyle  . Physical activity:    Days per week: Not on file    Minutes per session: Not on file  . Stress: Not on file  Relationships  . Social connections:    Talks on phone: Not on file    Gets together: Not on file    Attends religious service: Not on file    Active member of club or  organization: Not on file    Attends meetings of clubs or organizations: Not on file    Relationship status: Not on file  . Intimate partner violence:    Fear of current or ex partner: Not on file    Emotionally abused: Not on file    Physically abused: Not on file    Forced sexual activity: Not on file  Other Topics Concern  . Not on file  Social History Narrative  . Not on file    REVIEW OF SYSTEMS: Constitutional: No fevers, chills, or sweats, no generalized fatigue, change in appetite Eyes: No visual changes, double vision, eye pain Ear, nose and throat: No hearing loss, ear pain, nasal congestion, sore throat Cardiovascular: No chest pain, palpitations Respiratory:  No shortness of breath at rest or with exertion, wheezes GastrointestinaI: No nausea, vomiting, diarrhea, abdominal pain, fecal incontinence Genitourinary:  No dysuria, urinary retention or frequency Musculoskeletal:  No neck pain, back pain Integumentary: No rash, pruritus, skin lesions Neurological: as above Psychiatric: No depression, insomnia, anxiety Endocrine: No palpitations, fatigue, diaphoresis, mood swings, change in appetite, change in weight, increased thirst Hematologic/Lymphatic:  No purpura, petechiae. Allergic/Immunologic: no itchy/runny eyes, nasal congestion, recent allergic reactions, rashes  PHYSICAL EXAM: There were no vitals filed for this visit. General: No acute distress.  Patient appears ***-groomed.  *** Head:  Normocephalic/atraumatic Eyes:  fundi examined but not visualized Neck: supple, no paraspinal tenderness, full range of motion Back: No paraspinal tenderness Heart: regular rate and rhythm Lungs: Clear to auscultation bilaterally. Vascular: No carotid bruits. Neurological Exam: Mental status: alert and oriented to person, place, and time, recent and remote memory intact, fund of knowledge intact, attention and concentration intact, speech fluent and not dysarthric, language  intact. Cranial nerves: CN I: not tested CN II: pupils equal, round and reactive to light, visual fields intact CN III, IV, VI:  full range of motion, no nystagmus, no ptosis CN V: facial sensation intact CN VII: upper and lower face symmetric CN VIII: hearing intact CN IX, X: gag intact, uvula midline CN XI: sternocleidomastoid and trapezius muscles intact CN XII: tongue midline Bulk & Tone: normal, no fasciculations. Motor:  5/5 throughout *** Sensation:  Pinprick *** temperature *** and vibration sensation intact.  ***. Deep Tendon Reflexes:  2+ throughout, *** toes downgoing.  *** Finger to nose testing:  Without dysmetria.  *** Heel to shin:  Without dysmetria.  *** Gait:  Normal station and stride.  Able to turn and tandem walk. Romberg ***.  IMPRESSION: ***  PLAN: ***  Thank you for allowing me to take part in the care of this patient.  Metta Clines, DO  CC: ***

## 2018-04-14 ENCOUNTER — Other Ambulatory Visit (HOSPITAL_COMMUNITY): Payer: Self-pay | Admitting: Internal Medicine

## 2018-04-19 ENCOUNTER — Other Ambulatory Visit (HOSPITAL_COMMUNITY): Payer: Self-pay | Admitting: Internal Medicine

## 2018-04-20 ENCOUNTER — Ambulatory Visit (HOSPITAL_COMMUNITY)
Admission: RE | Admit: 2018-04-20 | Discharge: 2018-04-20 | Disposition: A | Discharge status: Home / Self Care | Payer: Medicare PPO | Source: Ambulatory Visit | Attending: Internal Medicine | Admitting: Internal Medicine

## 2018-04-20 ENCOUNTER — Other Ambulatory Visit: Payer: Self-pay | Admitting: *Deleted

## 2018-04-20 VITALS — BP 144/86 | HR 106 | Wt 127.2 lb

## 2018-04-20 DIAGNOSIS — I4892 Unspecified atrial flutter: Secondary | ICD-10-CM | POA: Diagnosis not present

## 2018-04-20 DIAGNOSIS — E871 Hypo-osmolality and hyponatremia: Secondary | ICD-10-CM | POA: Diagnosis not present

## 2018-04-20 DIAGNOSIS — Z7901 Long term (current) use of anticoagulants: Secondary | ICD-10-CM | POA: Insufficient documentation

## 2018-04-20 DIAGNOSIS — I110 Hypertensive heart disease with heart failure: Secondary | ICD-10-CM | POA: Diagnosis not present

## 2018-04-20 DIAGNOSIS — G4733 Obstructive sleep apnea (adult) (pediatric): Secondary | ICD-10-CM | POA: Diagnosis not present

## 2018-04-20 DIAGNOSIS — I10 Essential (primary) hypertension: Secondary | ICD-10-CM | POA: Diagnosis not present

## 2018-04-20 DIAGNOSIS — I2510 Atherosclerotic heart disease of native coronary artery without angina pectoris: Secondary | ICD-10-CM | POA: Diagnosis not present

## 2018-04-20 DIAGNOSIS — I5023 Acute on chronic systolic (congestive) heart failure: Secondary | ICD-10-CM | POA: Diagnosis not present

## 2018-04-20 DIAGNOSIS — G253 Myoclonus: Secondary | ICD-10-CM | POA: Insufficient documentation

## 2018-04-20 DIAGNOSIS — R21 Rash and other nonspecific skin eruption: Secondary | ICD-10-CM | POA: Diagnosis not present

## 2018-04-20 DIAGNOSIS — F419 Anxiety disorder, unspecified: Secondary | ICD-10-CM | POA: Insufficient documentation

## 2018-04-20 DIAGNOSIS — Z8249 Family history of ischemic heart disease and other diseases of the circulatory system: Secondary | ICD-10-CM | POA: Diagnosis not present

## 2018-04-20 DIAGNOSIS — G43909 Migraine, unspecified, not intractable, without status migrainosus: Secondary | ICD-10-CM | POA: Diagnosis not present

## 2018-04-20 DIAGNOSIS — Z7902 Long term (current) use of antithrombotics/antiplatelets: Secondary | ICD-10-CM | POA: Insufficient documentation

## 2018-04-20 DIAGNOSIS — I480 Paroxysmal atrial fibrillation: Secondary | ICD-10-CM | POA: Diagnosis not present

## 2018-04-20 DIAGNOSIS — Z79899 Other long term (current) drug therapy: Secondary | ICD-10-CM | POA: Insufficient documentation

## 2018-04-20 DIAGNOSIS — I671 Cerebral aneurysm, nonruptured: Secondary | ICD-10-CM | POA: Insufficient documentation

## 2018-04-20 DIAGNOSIS — R251 Tremor, unspecified: Secondary | ICD-10-CM | POA: Diagnosis not present

## 2018-04-20 DIAGNOSIS — I5022 Chronic systolic (congestive) heart failure: Secondary | ICD-10-CM

## 2018-04-20 DIAGNOSIS — Z87891 Personal history of nicotine dependence: Secondary | ICD-10-CM | POA: Insufficient documentation

## 2018-04-20 DIAGNOSIS — I721 Aneurysm of artery of upper extremity: Secondary | ICD-10-CM

## 2018-04-20 DIAGNOSIS — I447 Left bundle-branch block, unspecified: Secondary | ICD-10-CM | POA: Diagnosis not present

## 2018-04-20 DIAGNOSIS — Z7982 Long term (current) use of aspirin: Secondary | ICD-10-CM | POA: Insufficient documentation

## 2018-04-20 DIAGNOSIS — R000 Tachycardia, unspecified: Secondary | ICD-10-CM | POA: Insufficient documentation

## 2018-04-20 DIAGNOSIS — Z955 Presence of coronary angioplasty implant and graft: Secondary | ICD-10-CM | POA: Diagnosis not present

## 2018-04-20 DIAGNOSIS — N179 Acute kidney failure, unspecified: Secondary | ICD-10-CM | POA: Insufficient documentation

## 2018-04-20 DIAGNOSIS — I729 Aneurysm of unspecified site: Secondary | ICD-10-CM

## 2018-04-20 MED ORDER — SACUBITRIL-VALSARTAN 97-103 MG PO TABS: 1.0000 | ORAL_TABLET | Freq: Two times a day (BID) | ORAL | 6 refills | Status: DC

## 2018-04-20 MED ORDER — CARVEDILOL 3.125 MG PO TABS: 3.1250 mg | ORAL_TABLET | Freq: Two times a day (BID) | ORAL | 6 refills | Status: AC

## 2018-04-20 NOTE — Progress Notes (Signed)
.   Advanced Heart Failure Clinic Note     Primary Cardiologist: None  HPI:  [name] Rosales is a 77 y.o. female  (mother of Cory Munch) with h/o HTN and recently diagnosed systolic HF presents as a new patient for further evaluation of recent-onset systolic HF.   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 Has long h/o HTN. Was fine until April of this year when she developed severe ankle swelling followed by exertional dyspnea.  12/2017 May she developed respiratory failure with orthopnea and PND.  Went to urgent care and transferred to the [facility]. Limited notes in Care Everywhere which we reviewed. On arrival SBP was > 200. Echo as below showed EF ~40% with 2-3+ MR. Inferior HK. She was diuresed about 13 pounds. Also found to have OSA with AHI 23 started on CPAP. Renal artery u/s without evidence of RAS. Meds adjusted to get BP down and was started on clonidine 0.3 patch  Admitted from HF clinic 03/24/18 with worsening functional status and marked volume overload. Diuresed with IV lasix. Echo showed EF 30-35% as below. Taken for [facility] as below which showed 90% RCA stenosis. Treated with PCI and DES by Dr. Tamala Julian. Developed Afib post procedure leading to initiation of Eliquis 5 mg BID and amiodarone. She was in and out of Afib but ultimately converted to NSR on amiodarone.  Hospital course additionally complicated by uncontrollable shaking/tremors which led to Neuro consult. MRI as below. Work up further revealed positive ASA and SSA (Ro) and SSB (La) prompting Rheumoatology referral as outpatient. Discharge weight 126 lbs, though creatinine had trended up to 1.87. She was in NSR when she left on 04/01/18.  She presents today with her daughter for f/u. Overall much improved after course of steroid.. Seems to be getting stringer on a daily basis. Now able to do ADLs without much difficulty. Volume status relatively stable. Taking torsemide only as needed for swelling. No CP or SOB. No  bleeding. Continues to have prominent swelling at R radial site. No ulceration.   EKG today shows Atrial flutter 106 bpm. (Personally reviewed)  Echo 03/25/18 LVEF of 30-35%, severe HK with incoordinate, septal motion, grade 2 DD, Mild/Mod MR, Severe BAE, Moderate TR.   PCI 03/27/18 - Successful mid RCA PCI reducing greater than 95% stenosis with TIMI-3 flow to to 0% with TIMI grade III flow.  Final post dilatation pressure was 14 atm using the device balloon.  R/LHC 03/27/18  Mid RCA lesion is 90% stenosed.  Prox LAD to Mid LAD lesion is 20% stenosed. Findings: RA = 5 RV = 58/8 PA = 56/18 (34) PCW = 16 (v = 25) Fick cardiac output/index = 6.8/4.0 PVR =2.7 WU FA sat = 98% PA sat = 77%, 79% SVC sat = 75%   Studies: 12/2017 [facility]) The left ventricular ejection fraction is moderately reduced (~40%) with inferior HK  Flattened septum is consistent with right ventricular pressure overload.  There is normal left ventricular wall thickness.  The aortic valve is trileaflet with thin, pliable leaflets that move  normally.  There is moderate to moderately severe (2-3+) mitral regurgitation.  Septal motion is consistent with conduction abnormality.  The left atrium is severely dilated.  The right atrium is severely dilated.  There is moderate (2+) tricuspid regurgitation.  Right ventricular systolic pressure is elevated between 40-58m Hg,  consistent with moderate pulmonary hypertension.  There is a small size pericardial effusion.   Review of systems complete and found to be negative unless  listed in HPI.    PMHx: 1. HTN 2. Systolic HF  Current Outpatient Medications  Medication Sig Dispense Refill  . acetaminophen (TYLENOL) 500 MG tablet Take 500 mg by mouth daily as needed.    Marland Kitchen amiodarone (PACERONE) 200 MG tablet Take 1 tablet (200 mg total) by mouth 2 (two) times daily. On 04/08/2018, reduce to 1 tablet (255m) once daily. 60 tablet 5  . apixaban (ELIQUIS) 5 MG TABS  tablet Take 1 tablet (5 mg total) by mouth 2 (two) times daily. 60 tablet 5  . aspirin 81 MG chewable tablet Chew 1 tablet (81 mg total) by mouth daily. STOP TAKING on 04/27/2018.    .Marland Kitchenatorvastatin (LIPITOR) 80 MG tablet Take 1 tablet (80 mg total) by mouth daily at 6 PM. 30 tablet 5  . calcium carbonate (TUMS - DOSED IN MG ELEMENTAL CALCIUM) 500 MG chewable tablet Chew 2 tablets by mouth daily.    . clopidogrel (PLAVIX) 75 MG tablet Take 1 tablet (75 mg total) by mouth daily with breakfast. 30 tablet 5  . hydrALAZINE (APRESOLINE) 25 MG tablet Take 1 tablet (25 mg total) by mouth every 8 (eight) hours. 90 tablet 11  . hydrocortisone cream 1 % Apply topically 2 (two) times daily. 30 g 0  . multivitamin-lutein (OCUVITE-LUTEIN) CAPS capsule Take 1 capsule by mouth daily.    . nitroGLYCERIN (NITROSTAT) 0.4 MG SL tablet Place 1 tablet (0.4 mg total) under the tongue every 5 (five) minutes as needed for chest pain. 25 tablet 2  . NON FORMULARY CPAP    . phenytoin (DILANTIN) 100 MG ER capsule Take 1-2 capsules (100-200 mg total) by mouth See admin instructions. Take 200 mg every morning  Take 100 mg midday (12 pm) Take 100 mg at bedtime    . predniSONE (DELTASONE) 10 MG tablet Take 40 mg (4 tabs) for 2 days, then 30 mg (3 tabs) for 2 days, then 20 mg (2 tabs) for 2 days, then 10 mg (1 tab) for 2 days. 20 tablet 0  . sacubitril-valsartan (ENTRESTO) 49-51 MG Take 1 tablet by mouth 2 (two) times daily. 60 tablet 5  . spironolactone (ALDACTONE) 25 MG tablet Take 1 tablet (25 mg total) by mouth daily.    .Marland Kitchentorsemide (DEMADEX) 20 MG tablet Take 2 tablets (40 mg total) by mouth daily as needed (For weight greater than 128 lbs). 30 tablet 11  . traMADol (ULTRAM) 50 MG tablet Take 1 tablet (50 mg total) by mouth 3 (three) times daily as needed. 30 tablet 0   No current facility-administered medications for this encounter.     No Known Allergies    Social History   Socioeconomic History  . Marital status:  Single    Spouse name: Not on file  . Number of children: Not on file  . Years of education: Not on file  . Highest education level: Not on file  Occupational History  . Not on file  Social Needs  . Financial resource strain: Not on file  . Food insecurity:    Worry: Not on file    Inability: Not on file  . Transportation needs:    Medical: Not on file    Non-medical: Not on file  Tobacco Use  . Smoking status: Former SResearch scientist (life sciences) . Smokeless tobacco: Never Used  Substance and Sexual Activity  . Alcohol use: Not on file  . Drug use: Not on file  . Sexual activity: Not on file  Lifestyle  . Physical activity:  Days per week: Not on file    Minutes per session: Not on file  . Stress: Not on file  Relationships  . Social connections:    Talks on phone: Not on file    Gets together: Not on file    Attends religious service: Not on file    Active member of club or organization: Not on file    Attends meetings of clubs or organizations: Not on file    Relationship status: Not on file  . Intimate partner violence:    Fear of current or ex partner: Not on file    Emotionally abused: Not on file    Physically abused: Not on file    Forced sexual activity: Not on file  Other Topics Concern  . Not on file  Social History Narrative  . Not on file    FHx: Father died from MVA Mother had HTN. Died at 50 Grandmother had HF No family h/o premature CAD  Vitals:   04/20/18 1326  BP: (!) 144/86  Pulse: (!) 106  SpO2: 98%  Weight: 57.7 kg (127 lb 3.2 oz)   Wt Readings from Last 3 Encounters:  04/20/18 57.7 kg (127 lb 3.2 oz)  04/07/18 57.2 kg (126 lb)  04/01/18 57.4 kg (126 lb 8.7 oz)    PHYSICAL EXAM: General:  Thin. Elderly female  No resp difficulty HEENT: normal rash resolved Neck: supple. JVP 6-7. Carotids 2+ bilat; no bruits. No lymphadenopathy or thryomegaly appreciated. Cor: PMI nondisplaced. Irregular. tachy. No rubs, gallops or murmurs. Lungs: clear but mildly  decreased throughout Abdomen: soft, nontender, nondistended. No hepatosplenomegaly. No bruits or masses. Good bowel sounds. Extremities: no cyanosis, clubbing, rash,1+ edema Prominent aneurysm over right radial site. No ulceration  Neuro: alert & orientedx3, cranial nerves grossly intact. moves all 4 extremities w/o difficulty. Affect pleasant   ASSESSMENT & PLAN: 1. 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 Differential also includes ischemic CM, infiltrative CM (amyloid) and CTD-related PAH/HF. Admitted with massive volume overload and NYHA IIIB-IV symptoms.  cMRI 03/28/18 with LVEF 40% and no LGE. Echo 03/25/18 EF 35%.  Weight down > 20 lbs.  - Continues to improve. NYHA II-III. Suspect also had component of connective tissue disease flare - no improved with prednisone - Volume status ok. Doing well with sliding scale diuretics. Continue torsemide 20 mg daily PRN only - Increase Entresto to 97/103 bid.   - Restart carvedilol 3.125 bid - Continue spiro 25 mg daily - Continue hydralazine 25 tid. Can wean off as needed. Keep SBP > 110 - Reinforced fluid restriction to < 2 L daily, sodium restriction to less than 2000 mg daily, and the importance of daily weights.   2. Severe HTN:  - BP climbing back up - Changes to meds as above  3. CAD:  - 03/27/18 L/RHC showed 1v CAD with high grade mRCA stenosis (otherwise non-obstructive CAD) with mild pulmonary HTN with prominent V waves suggestive MR vs significant diastolic dysfunction.  - No s/s ischemia - Continue ASA, plavix, and Eliquis.    - Will drop ASA after 1 month (next week) - Will need plavix for at least 6 months. 4. PAF/AFL , new onset:  - She has had both atrial fib and flutter. She is back in AFL today. Rate slightly elevated.  - She is now on Eliquis.  - Continue amio  200 bid. May need  DC-CV but will need to deal with radial PSA first 5. Facial rash/?possible lups:  - Reviewed with Dermatology during recent admission. 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 + Ro and La antibodies suggestive of possible Sjogrens. dsDNA negative - I discussed with Dr. Amil Amen in Rheum. We placed her on steroid taper and she is MUCH improved. He will see her soon- CK, aldolase to check for dermatomyositis  were negative. P-ANCA initially elevated to 1:80. Negative on recheck/  6. LBBB:  - Consider CRT depending on LV EF in future. No change.  7. OSA:  - Continue CPAP qhs. No change.  8. Migraines:  - Much improved. Continue Phenytoin. Had f/u with Neruology.  9. Hyponatremia:  - Chronic. Na 125 at discharge. 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 DDx is broad but includes phenytoin-induced chorea and hyponatremia. MRI brain 03/28/18 with acute occipital infarct and chronic microvascular changes in setting of atrial fibrillation. Heavy metals negative. IgG + at > 1800. (Normal [phone] mg/dL). CT Chest/Abdomen/Pelvis 03/29/18 with no evidence for malignancy. ? Worsened by fatigue and anxiety - Wil f/u with Neurology 12. ICA aneurysm: - Incidental finding. Felt to be small.  - Will need repeat CTA in February 2020. No change.  13. AKI:  Creatinine 1.8 on d/c. 1.08 today 14. 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 No skin ulceration or other complications.  - I d/w Dr. Trula Slade personally will have surgical closure next Wednesday. Hold Eliquis 48 hours before.  - Will use TR band with general compression at home.    Total time spent 45 minutes. Over half that time spent discussing above.   Glori Bickers, MD    2:07 PM

## 2018-04-20 NOTE — Progress Notes (Signed)
Right wrist limited arterial duplex prelim:   Pseudoaneurysm noted at the right wrist measuring 1.96 x 1 cm with partial thrombus around walls. Pseudo neck is 0.37 cm wide.   Farrel DemarkJill Eunice, RDMS, RVT

## 2018-04-20 NOTE — Patient Instructions (Addendum)
Stop Aspirin on Monday 04/24/18  Start Carvedilol 3.125 mg Twice daily   Increase Entresto to 97/103 mg Twice daily   STOP ELIQUIS AFTER YOUR DOSE ON Sunday PM  Dr Estanislado SpireBrabham's office will call you about surgery on Wed 04/26/18  Your physician recommends that you schedule a follow-up appointment in: 2 months

## 2018-04-23 ENCOUNTER — Telehealth: Payer: Self-pay | Admitting: Physician Assistant

## 2018-04-23 NOTE — Telephone Encounter (Signed)
Daughter, [name] Rosales, called the answering service today regarding hypotension.  Pt seen by DB 08/23. BP still above target. She took the torsemide that day, but not since then. Her [name] was increased to 97/103 and Coreg 3.125 mg bid   08/24, 2 doses of hydralazine were held due to low BP, but got the Coreg x 2 doses and the Entresto  08/25, SBP 113/72, HR about 100 prior to getting medications. Has had Entresto and Coreg but not hydralazine. 84/56, HR 110 Repeat 91/58, HR 91 an hour later.  Plan: go back on the previous dose of Entresto. Continue the carvedilol Hold the hydralazine for systolic blood pressure less than 110 Try to spread out the medications as much as possible or as is reasonable to see if that helps maintain her blood pressure.  Dr. Gala Rosales to review this and decide if any additional changes are needed and if she needs to be seen in clinic sooner than 10/24, her next scheduled appointment.  [name] DemarkRhonda Anthonny Schiller, PA-C 04/23/2018 12:23 PM Beeper [phone]

## 2018-04-24 NOTE — Progress Notes (Signed)
NEUROLOGY CONSULTATION NOTE  [name] Rosales MRN: [id number] DOB: 1941/01/20  Referring provider: Arvilla Meres, MD Primary care provider: No PCP  Reason for consult:  Migraines  HISTORY OF PRESENT ILLNESS: [name] Rosales is a 77 year old right-handed female with systolic heart failure, CAD, paroxysmal atrial fibrillation and hypertension who presents for migraines.  She is accompanied by her daughter who supplements history.  History supplemented by referring provider's note and hospital records.  She was admitted to [facility] on 03/24/18 for right/left heart catheterization coronary angiogram with diuresis.  During her hospitalization, she demonstrated abnormal shivering followed by jerking movements.  She exhibited similar movements in May when she was admitted at [facility] for CHF exacerbation and it was thought to be secondary to diuresis.  Patient reports feeling cold during the episodes.  Neurology was consulted and reported observing low to medium amplitude choreiform movements involving torso, shoulders, neck and extremities.  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  MRA of head showed tortuous right PCA P1 segment, ICA siphon atherosclerosis without stenosis, and distal right ICA 3-51mm infundibulum vs aneurysm.  Carotid ultrasound showed mild atherosclerosis but no hemodynamically significant stenosis.  EEG was normal.  She also had a rash.  Labs showed positive ANA, positive SSA/SSB antibodies, and elevated serum IgG, suggestive of SLE.  B12 was 287.  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  Etiology of choreiform movements unclear but differential included hyponatremia (dropped to 126) or possibly side effect of Phenytoin.  Phenytoin was decreased to 50mg  three times daily from 200mg /100mg /100mg .  Onset:  20s Location:   Bifrontal/temporal Quality:  pressure Intensity:  Severe.  She denies new headache, thunderclap headache or severe headache that wakes her from sleep. Aura:  Sometimes sees wavy lines in her vision Prodrome:  no Postdrome:  Fatigue, pressure Associated symptoms:  Photophobia, phonophobia.  She no longer has nausea and vomiting.  During migraines, she has vivid and sometimes unpleasant dreams.  She denies associated unilateral numbness or weakness. Duration:  At least a week (pain lasts 2-3 days, followed by pressure) Frequency:  Usually once a month.   Frequency of abortive medication: as needed during a migraine Triggers:  No Exacerbating factors:  No Relieving factors:  Tylenol Activity:  aggravates  Current NSAIDS:  ASA 81mg  Current analgesics:  Tylenol, tramadol Current triptans:  no Current ergotamine:  no Current anti-emetic:  no Current muscle relaxants:  no Current anti-anxiolytic:  no Current sleep aide:  no Current Antihypertensive medications:  spironolactone, Coreg, Entresto, hydralazine Current Antidepressant medications:  no Current Anticonvulsant medications:  Phenytoin 100mg  three times daily Current anti-CGRP:  no Current Vitamins/Herbal/Supplements:  MVI Current Antihistamines/Decongestants:  no Other therapy:  no  Past NSAIDS:  ibuprofen Past analgesics:  no Past abortive triptans:  no Past abortive ergotamine:  no Past muscle relaxants:  no Past anti-emetic:  no Past antihypertensive medications:  Amlodipine, clonidine, Toprol XL Past antidepressant medications:  no Past anticonvulsant medications:  no Past anti-CGRP:  no Past vitamins/Herbal/Supplements:  no Past antihistamines/decongestants:  no Other past therapies:  no  Caffeine:  Occasional coffee in AM Alcohol:  no Smoker:  no Diet:  Propell water 2-3 bottles a day, herbal tea, she does not skip meals Exercise:  yes Depression:  no Anxiety:  no Sleep hygiene:  good Family history of  headache:  no  04/07/18 LABS:  Sed Rate 29; CMP with Na 127, K 3.5, Cl  91, CO2 26, glucose 99, BUN 19, Cr 1.08, t bili 0.8, ALP 142, AST 45, ALT 21; CBC with WBC 3.8, HGB 12.4, HCT 37.9, PLT 289; CK 44, aldolase 5.8, c-ANCA <1:20, p-ANCA 1:80  PAST MEDICAL HISTORY: Past Medical History:  Diagnosis Date  . Choreiform movements   . Fluid overload   . Heart failure (HCC)   . Hypertension   . Lower extremity pain     PAST SURGICAL HISTORY: Past Surgical History:  Procedure Laterality Date  . CORONARY STENT INTERVENTION N/A 03/27/2018   Procedure: CORONARY STENT INTERVENTION;  Surgeon: Lyn Records, MD;  Location: [facility] INVASIVE CV LAB;  Service: Cardiovascular;  Laterality: N/A;  . RIGHT/LEFT HEART CATH AND CORONARY ANGIOGRAPHY N/A 03/27/2018   Procedure: RIGHT/LEFT HEART CATH AND CORONARY ANGIOGRAPHY;  Surgeon: Dolores Patty, MD;  Location: [facility] INVASIVE CV LAB;  Service: Cardiovascular;  Laterality: N/A;    MEDICATIONS: Current Outpatient Medications on File Prior to Visit  Medication Sig Dispense Refill  . acetaminophen (TYLENOL) 500 MG tablet Take 1,000 mg by mouth every 6 (six) hours as needed for moderate pain or headache.     Marland Kitchen amiodarone (PACERONE) 200 MG tablet Take 1 tablet (200 mg total) by mouth 2 (two) times daily. On 04/08/2018, reduce to 1 tablet (200mg ) once daily. (Patient taking differently: Take 200 mg by mouth daily. ) 60 tablet 5  . apixaban (ELIQUIS) 5 MG TABS tablet Take 1 tablet (5 mg total) by mouth 2 (two) times daily. 60 tablet 5  . aspirin 81 MG chewable tablet Chew 1 tablet (81 mg total) by mouth daily. STOP TAKING on 04/27/2018.    Marland Kitchen atorvastatin (LIPITOR) 80 MG tablet Take 1 tablet (80 mg total) by mouth daily at 6 PM. 30 tablet 5  . calcium carbonate (TUMS - DOSED IN MG ELEMENTAL CALCIUM) 500 MG chewable tablet Chew 1 tablet by mouth daily as needed for indigestion or heartburn.     . carvedilol (COREG) 3.125 MG tablet Take 1 tablet (3.125 mg total) by mouth 2  (two) times daily. 60 tablet 6  . clopidogrel (PLAVIX) 75 MG tablet Take 1 tablet (75 mg total) by mouth daily with breakfast. 30 tablet 5  . hydrALAZINE (APRESOLINE) 25 MG tablet Take 1 tablet (25 mg total) by mouth every 8 (eight) hours. 90 tablet 11  . hydrocortisone cream 1 % Apply topically 2 (two) times daily. (Patient taking differently: Apply 1 application topically 2 (two) times daily. ) 30 g 0  . multivitamin-lutein (OCUVITE-LUTEIN) CAPS capsule Take 1 capsule by mouth daily.    . nitroGLYCERIN (NITROSTAT) 0.4 MG SL tablet Place 1 tablet (0.4 mg total) under the tongue every 5 (five) minutes as needed for chest pain. 25 tablet 2  . phenytoin (DILANTIN) 100 MG ER capsule Take 1-2 capsules (100-200 mg total) by mouth See admin instructions. Take 200 mg every morning  Take 100 mg midday (12 pm) Take 100 mg at bedtime    . sacubitril-valsartan (ENTRESTO) 49-51 MG Take 1 tablet by mouth 2 (two) times daily.    . sacubitril-valsartan (ENTRESTO) 97-103 MG Take 1 tablet by mouth 2 (two) times daily. (Patient not taking: Reported on 04/21/2018) 60 tablet 6  . spironolactone (ALDACTONE) 25 MG tablet Take 1 tablet (25 mg total) by mouth daily.    Marland Kitchen torsemide (DEMADEX) 20 MG tablet Take 2 tablets (40 mg total) by mouth daily as needed (For weight greater than 128 lbs). 30 tablet 11  . traMADol (ULTRAM) 50 MG  tablet Take 1 tablet (50 mg total) by mouth 3 (three) times daily as needed. (Patient taking differently: Take 50 mg by mouth 3 (three) times daily as needed for moderate pain. ) 30 tablet 0   No current facility-administered medications on file prior to visit.     ALLERGIES: No Known Allergies  FAMILY HISTORY: No family history on file.  SOCIAL HISTORY: Social History   Socioeconomic History  . Marital status: Single    Spouse name: Not on file  . Number of children: Not on file  . Years of education: Not on file  . Highest education level: Not on file  Occupational History  . Not  on file  Social Needs  . Financial resource strain: Not on file  . Food insecurity:    Worry: Not on file    Inability: Not on file  . Transportation needs:    Medical: Not on file    Non-medical: Not on file  Tobacco Use  . Smoking status: Former Games developer  . Smokeless tobacco: Never Used  Substance and Sexual Activity  . Alcohol use: Not on file  . Drug use: Not on file  . Sexual activity: Not on file  Lifestyle  . Physical activity:    Days per week: Not on file    Minutes per session: Not on file  . Stress: Not on file  Relationships  . Social connections:    Talks on phone: Not on file    Gets together: Not on file    Attends religious service: Not on file    Active member of club or organization: Not on file    Attends meetings of clubs or organizations: Not on file    Relationship status: Not on file  . Intimate partner violence:    Fear of current or ex partner: Not on file    Emotionally abused: Not on file    Physically abused: Not on file    Forced sexual activity: Not on file  Other Topics Concern  . Not on file  Social History Narrative  . Not on file    REVIEW OF SYSTEMS: Constitutional: No fevers, chills, or sweats, no generalized fatigue, change in appetite Eyes: No visual changes, double vision, eye pain Ear, nose and throat: No hearing loss, ear pain, nasal congestion, sore throat Cardiovascular: No chest pain, palpitations Respiratory:  No shortness of breath at rest or with exertion, wheezes GastrointestinaI: No nausea, vomiting, diarrhea, abdominal pain, fecal incontinence Genitourinary:  No dysuria, urinary retention or frequency Musculoskeletal:  No neck pain, back pain Integumentary: No rash, pruritus, skin lesions Neurological: as above Psychiatric: No depression, insomnia, anxiety Endocrine: No palpitations, fatigue, diaphoresis, mood swings, change in appetite, change in weight, increased thirst Hematologic/Lymphatic:  No purpura,  petechiae. Allergic/Immunologic: no itchy/runny eyes, nasal congestion, recent allergic reactions, rashes  PHYSICAL EXAM: Blood pressure 130/88, pulse 100, resp. rate (!) 22, height 5\' 6"  (1.676 m), weight 127 lb (57.6 kg). General: No acute distress.  Patient appears well-groomed.  Head:  Normocephalic/atraumatic Eyes:  fundi examined but not visualized Neck: supple, no paraspinal tenderness, full range of motion Back: No paraspinal tenderness Heart: regular rate and rhythm Lungs: Clear to auscultation bilaterally. Vascular: No carotid bruits. Neurological Exam: Mental status: alert and oriented to person, place, and time, recent and remote memory intact, fund of knowledge intact, attention and concentration intact, speech fluent and not dysarthric, language intact. Cranial nerves: CN I: not tested CN II: pupils equal, round and reactive to light,  visual fields intact CN III, IV, VI:  full range of motion, no nystagmus, no ptosis CN V: facial sensation intact CN VII: upper and lower face symmetric CN VIII: hearing intact CN IX, X: gag intact, uvula midline CN XI: sternocleidomastoid and trapezius muscles intact CN XII: tongue midline Bulk & Tone: normal, no fasciculations. Motor:  5/5 throughout Sensation: temperature and vibration sensation intact. Deep Tendon Reflexes:  2+ throughout, toes downgoing.  Finger to nose testing:  Without dysmetria.  Gait:  Cautious gait.  Unsteady when she turns.. Romberg with sway.  IMPRESSION: 1.  Migraine with aura, without status migrainosus, intractable 2.  Tremors, not exhibiting them today.  Based on her description, it sounds like shivering.  Likely physical stress-induced (only occurred while in hospital) and not some underlying neurodegenerative, epileptic, autoimmune/infectious or medication-induced reaction  PLAN: 1.  I would like to transition her to another daily preventative other than Dilantin, given the long term side effects.   After discussing with Dr. Gala RomneyBensimhon, I will start nortriptyline 10mg  at bedtime.  She will contact me in 4 weeks with update and we can increase dose if needed.  She will remain on Dilantin for now. 2.  2.  Continue Tylenol for acute migraine.  Limit use of pain relievers to no more than 2 days out of the week.  These medications include acetaminophen, ibuprofen, triptans and narcotics.  This will help reduce risk of rebound headaches. 3.  Be aware of common food triggers such as processed sweets, processed foods with nitrites (such as deli meat, hot dogs, sausages), foods with MSG, alcohol (such as wine), chocolate, certain cheeses, certain fruits (dried fruits, bananas, some citrus fruit), vinegar, diet soda. 4.  Avoid caffeine 5.  Routine exercise 6.  Proper sleep hygiene 7.  Stay adequately hydrated with water 8.  Keep a headache diary. 9.  Maintain proper stress management. 10.  Do not skip meals. 11.  Consider supplements:  Magnesium citrate 400mg  to 600mg  daily, riboflavin 400mg , Coenzyme Q 10 100mg  three times daily 12.  Follow up in 3 to 4 months  Thank you for allowing me to take part in the care of this patient.  Shon MilletAdam Jaffe, DO  CC: Arvilla Meresaniel Bensimhon, MD

## 2018-04-25 ENCOUNTER — Encounter: Payer: Self-pay | Admitting: Neurology

## 2018-04-25 ENCOUNTER — Ambulatory Visit: Payer: Medicare PPO | Admitting: Neurology

## 2018-04-25 ENCOUNTER — Encounter (HOSPITAL_COMMUNITY): Payer: Self-pay | Admitting: *Deleted

## 2018-04-25 ENCOUNTER — Other Ambulatory Visit: Payer: Self-pay

## 2018-04-25 VITALS — BP 130/88 | HR 100 | Resp 22 | Ht 66.0 in | Wt 127.0 lb

## 2018-04-25 DIAGNOSIS — G43119 Migraine with aura, intractable, without status migrainosus: Secondary | ICD-10-CM

## 2018-04-25 NOTE — Progress Notes (Signed)
Anesthesia Chart Review: SAME DAY WORKUP   Case:  [id number] Date/Time:  04/26/18 1217   Procedure:  RADIAL ARTERY REPAIR (Right )   Anesthesia type:  General   Pre-op diagnosis:  right radial artery pseudoaneurysm   Location:  [facility] OR ROOM 11 / [facility] OR   Surgeon:  Nada LibmanBrabham, Vance W, MD      DISCUSSION: [id number] female former smoker for above procedure. Pertinent Hx includes HTN, CHF (Echo 03/25/18 EF 35%), LBBB, Myoclonic tremors, Renal insufficiency, OSA on CPAP, Migraines, Chronic HypoNA, PAF/AFL, CAD (03/27/18 L/RHC showed 1v CAD with high grade mRCA stenosis (otherwise non-obstructive CAD) with mild pulmonary HTN with prominent V waves suggestive MR vs significant diastolic dysfunction).  See Dr. Prescott GumBensimhon's note from 04/20/2018 for comprehensive review of cardiac history and cardiac studies.  Per surgery posting she will stay on ASA and Plavix.  Anticipate she can proceed with surgery as planned barring acute status change.  VS: There were no vitals taken for this visit.  PROVIDERSReynold Bowen: Diggs, Teresa, NP is PCP last seen 02/21/2018  Arvilla MeresBensimhon, Daniel, MD is Heart Failure cardiologist last seen 04/20/2018  LABS: Will need DOS labs, most recent labs displayed below: Results for [name] BolognaOTEAT, [name] (MRN [id number]) as of 04/25/2018 09:59  Ref. Range 04/07/2018 13:19  COMPREHENSIVE METABOLIC PANEL Unknown Rpt (A)  Sodium Latest Ref Range: 135 - 145 mmol/L 127 (L)  Potassium Latest Ref Range: 3.5 - 5.1 mmol/L 3.5  Chloride Latest Ref Range: 98 - 111 mmol/L 91 (L)  CO2 Latest Ref Range: 22 - 32 mmol/L 26  Glucose Latest Ref Range: 70 - 99 mg/dL 99  BUN Latest Ref Range: 8 - 23 mg/dL 19  Creatinine Latest Ref Range: 0.44 - 1.00 mg/dL 9.141.08 (H)  Calcium Latest Ref Range: 8.9 - 10.3 mg/dL 8.7 (L)  Anion gap Latest Ref Range: 5 - 15  10  Alkaline Phosphatase Latest Ref Range: 38 - 126 U/L 142 (H)  Albumin Latest Ref Range: 3.5 - 5.0 g/dL 3.6  AST Latest Ref Range: 15 - 41 U/L 45 (H)  ALT Latest Ref Range: 0 -  44 U/L 21  Total Protein Latest Ref Range: 6.5 - 8.1 g/dL 7.5  Total Bilirubin Latest Ref Range: 0.3 - 1.2 mg/dL 0.8  GFR, Est Non African American Latest Ref Range: >60 mL/min 48 (L)  GFR, Est African American Latest Ref Range: >60 mL/min 56 (L)  CK Total Latest Ref Range: 38 - 234 U/L 44  Aldolase Latest Ref Range: 3.3 - 10.3 U/L 5.8  WBC Latest Ref Range: 4.0 - 10.5 K/uL 3.8 (L)  RBC Latest Ref Range: 3.87 - 5.11 MIL/uL 3.78 (L)  Hemoglobin Latest Ref Range: 12.0 - 15.0 g/dL 78.212.4  HCT Latest Ref Range: 36.0 - 46.0 % 37.9  MCV Latest Ref Range: 78.0 - 100.0 fL 100.3 (H)  MCH Latest Ref Range: 26.0 - 34.0 pg 32.8  MCHC Latest Ref Range: 30.0 - 36.0 g/dL 95.632.7  RDW Latest Ref Range: 11.5 - 15.5 % 14.3  Platelets Latest Ref Range: 150 - 400 K/uL 289  Sed Rate Latest Ref Range: 0 - 22 mm/hr 29 (H)  Cytoplasmic (C-ANCA) Latest Ref Range: Neg:<1:20 titer <1:20  P-ANCA Latest Ref Range: Neg:<1:20 titer <1:20  Atypical P-ANCA titer Latest Ref Range: Neg:<1:20 titer <1:20     Past Medical History:  Diagnosis Date  . Choreiform movements   . Fluid overload   . Heart failure (HCC)   . Hypertension   . Lower extremity pain  Past Surgical History:  Procedure Laterality Date  . CORONARY STENT INTERVENTION N/A 03/27/2018   Procedure: CORONARY STENT INTERVENTION;  Surgeon: Lyn Records, MD;  Location: [facility] INVASIVE CV LAB;  Service: Cardiovascular;  Laterality: N/A;  . RIGHT/LEFT HEART CATH AND CORONARY ANGIOGRAPHY N/A 03/27/2018   Procedure: RIGHT/LEFT HEART CATH AND CORONARY ANGIOGRAPHY;  Surgeon: Dolores Patty, MD;  Location: [facility] INVASIVE CV LAB;  Service: Cardiovascular;  Laterality: N/A;    MEDICATIONS: No current facility-administered medications for this encounter.    Marland Kitchen acetaminophen (TYLENOL) 500 MG tablet  . amiodarone (PACERONE) 200 MG tablet  . apixaban (ELIQUIS) 5 MG TABS tablet  . atorvastatin (LIPITOR) 80 MG tablet  . calcium carbonate (TUMS - DOSED IN MG ELEMENTAL  CALCIUM) 500 MG chewable tablet  . carvedilol (COREG) 3.125 MG tablet  . clopidogrel (PLAVIX) 75 MG tablet  . hydrALAZINE (APRESOLINE) 25 MG tablet  . hydrocortisone cream 1 %  . multivitamin-lutein (OCUVITE-LUTEIN) CAPS capsule  . nitroGLYCERIN (NITROSTAT) 0.4 MG SL tablet  . phenytoin (DILANTIN) 100 MG ER capsule  . sacubitril-valsartan (ENTRESTO) 49-51 MG  . spironolactone (ALDACTONE) 25 MG tablet  . torsemide (DEMADEX) 20 MG tablet  . traMADol (ULTRAM) 50 MG tablet  . aspirin 81 MG chewable tablet  . sacubitril-valsartan (ENTRESTO) 97-103 MG     Zannie Cove [facility] Short Stay Center/Anesthesiology Phone [phone] 04/25/2018 10:00 AM

## 2018-04-25 NOTE — Patient Instructions (Signed)
Migraine Recommendations: 1.  Continue Dilantin 100mg  three times daily for now.  I will contact Dr. Gala RomneyBensimhon to see which alternative medications would be okay and then I will contact you 2.  Continue Tylenol for acute migraine.  Limit use of pain relievers to no more than 2 days out of the week.  These medications include acetaminophen, ibuprofen, triptans and narcotics.  This will help reduce risk of rebound headaches. 3.  Be aware of common food triggers such as processed sweets, processed foods with nitrites (such as deli meat, hot dogs, sausages), foods with MSG, alcohol (such as wine), chocolate, certain cheeses, certain fruits (dried fruits, bananas, some citrus fruit), vinegar, diet soda. 4.  Avoid caffeine 5.  Routine exercise 6.  Proper sleep hygiene 7.  Stay adequately hydrated with water 8.  Keep a headache diary. 9.  Maintain proper stress management. 10.  Do not skip meals. 11.  Consider supplements:  Magnesium citrate 400mg  to 600mg  daily, riboflavin 400mg , Coenzyme Q 10 100mg  three times daily 12.  Follow up in 3 to 4 months

## 2018-04-26 ENCOUNTER — Ambulatory Visit (HOSPITAL_COMMUNITY): Payer: Medicare PPO | Admitting: Physician Assistant

## 2018-04-26 ENCOUNTER — Other Ambulatory Visit: Payer: Self-pay

## 2018-04-26 ENCOUNTER — Encounter (HOSPITAL_COMMUNITY): Payer: Self-pay | Admitting: *Deleted

## 2018-04-26 ENCOUNTER — Ambulatory Visit (HOSPITAL_COMMUNITY)
Admission: RE | Admit: 2018-04-26 | Discharge: 2018-04-26 | Disposition: A | Discharge status: Home / Self Care | Payer: Medicare PPO | Source: Ambulatory Visit | Attending: Surgery | Admitting: Surgery

## 2018-04-26 ENCOUNTER — Encounter (HOSPITAL_COMMUNITY)
Admission: RE | Disposition: A | Discharge status: Home / Self Care | Payer: Self-pay | Source: Ambulatory Visit | Attending: Surgery

## 2018-04-26 DIAGNOSIS — I2510 Atherosclerotic heart disease of native coronary artery without angina pectoris: Secondary | ICD-10-CM | POA: Diagnosis not present

## 2018-04-26 DIAGNOSIS — Z955 Presence of coronary angioplasty implant and graft: Secondary | ICD-10-CM | POA: Diagnosis not present

## 2018-04-26 DIAGNOSIS — I110 Hypertensive heart disease with heart failure: Secondary | ICD-10-CM | POA: Insufficient documentation

## 2018-04-26 DIAGNOSIS — Z7982 Long term (current) use of aspirin: Secondary | ICD-10-CM | POA: Diagnosis not present

## 2018-04-26 DIAGNOSIS — Z7902 Long term (current) use of antithrombotics/antiplatelets: Secondary | ICD-10-CM | POA: Diagnosis not present

## 2018-04-26 DIAGNOSIS — Z79899 Other long term (current) drug therapy: Secondary | ICD-10-CM | POA: Insufficient documentation

## 2018-04-26 DIAGNOSIS — Z7901 Long term (current) use of anticoagulants: Secondary | ICD-10-CM | POA: Diagnosis not present

## 2018-04-26 DIAGNOSIS — I509 Heart failure, unspecified: Secondary | ICD-10-CM | POA: Diagnosis not present

## 2018-04-26 DIAGNOSIS — I447 Left bundle-branch block, unspecified: Secondary | ICD-10-CM | POA: Diagnosis not present

## 2018-04-26 DIAGNOSIS — G4733 Obstructive sleep apnea (adult) (pediatric): Secondary | ICD-10-CM | POA: Insufficient documentation

## 2018-04-26 DIAGNOSIS — Z87891 Personal history of nicotine dependence: Secondary | ICD-10-CM | POA: Insufficient documentation

## 2018-04-26 DIAGNOSIS — I721 Aneurysm of artery of upper extremity: Secondary | ICD-10-CM | POA: Insufficient documentation

## 2018-04-26 DIAGNOSIS — N289 Disorder of kidney and ureter, unspecified: Secondary | ICD-10-CM | POA: Diagnosis not present

## 2018-04-26 DIAGNOSIS — R251 Tremor, unspecified: Secondary | ICD-10-CM | POA: Insufficient documentation

## 2018-04-26 DIAGNOSIS — Z9889 Other specified postprocedural states: Secondary | ICD-10-CM | POA: Diagnosis not present

## 2018-04-26 HISTORY — PX: ARTERY REPAIR: SHX559

## 2018-04-26 HISTORY — DX: Headache, unspecified: R51.9

## 2018-04-26 HISTORY — DX: Headache: R51

## 2018-04-26 HISTORY — DX: Presence of dental prosthetic device (complete) (partial): Z97.2

## 2018-04-26 HISTORY — DX: Sleep apnea, unspecified: G47.30

## 2018-04-26 LAB — CBC
HEMATOCRIT: 34.5 % — AB (ref 36.0–46.0)
HEMOGLOBIN: 11.2 g/dL — AB (ref 12.0–15.0)
MCH: 33.1 pg (ref 26.0–34.0)
MCHC: 32.5 g/dL (ref 30.0–36.0)
MCV: 102.1 fL — ABNORMAL HIGH (ref 78.0–100.0)
Platelets: 187 10*3/uL (ref 150–400)
RBC: 3.38 MIL/uL — ABNORMAL LOW (ref 3.87–5.11)
RDW: 14.9 % (ref 11.5–15.5)
WBC: 4 10*3/uL (ref 4.0–10.5)

## 2018-04-26 LAB — BASIC METABOLIC PANEL
ANION GAP: 8 (ref 5–15)
BUN: 12 mg/dL (ref 8–23)
CHLORIDE: 95 mmol/L — AB (ref 98–111)
CO2: 26 mmol/L (ref 22–32)
Calcium: 8.7 mg/dL — ABNORMAL LOW (ref 8.9–10.3)
Creatinine, Ser: 0.77 mg/dL (ref 0.44–1.00)
GFR calc non Af Amer: >60 mL/min (ref >60)
Glucose, Bld: 104 mg/dL — ABNORMAL HIGH (ref 70–99)
POTASSIUM: 4.1 mmol/L (ref 3.5–5.1)
Sodium: 129 mmol/L — ABNORMAL LOW (ref 135–145)

## 2018-04-26 SURGERY — REPAIR, ARTERY, BRACHIAL
Anesthesia: Monitor Anesthesia Care | Site: Arm Lower | Laterality: Right

## 2018-04-26 MED ORDER — PROMETHAZINE HCL 25 MG/ML IJ SOLN: 6.2500 mg | INTRAMUSCULAR | Status: DC | PRN

## 2018-04-26 MED ORDER — OXYCODONE-ACETAMINOPHEN 5-325 MG PO TABS: 1.0000 | ORAL_TABLET | Freq: Four times a day (QID) | ORAL | 0 refills | Status: DC | PRN

## 2018-04-26 MED ORDER — CEFAZOLIN SODIUM-DEXTROSE 2-4 GM/100ML-% IV SOLN
2.0000 g | INTRAVENOUS | Status: AC
  Administered 2018-04-26: 2 g via INTRAVENOUS
  Filled 2018-04-26: qty 100

## 2018-04-26 MED ORDER — MIDAZOLAM HCL 2 MG/2ML IJ SOLN
INTRAMUSCULAR | Status: DC | PRN
  Administered 2018-04-26: 1 mg via INTRAVENOUS

## 2018-04-26 MED ORDER — LIDOCAINE-EPINEPHRINE (PF) 1 %-1:200000 IJ SOLN
INTRAMUSCULAR | Status: AC
  Filled 2018-04-26: qty 30

## 2018-04-26 MED ORDER — LIDOCAINE HCL (CARDIAC) PF 100 MG/5ML IV SOSY
PREFILLED_SYRINGE | INTRAVENOUS | Status: DC | PRN
  Administered 2018-04-26: 100 mg via INTRAVENOUS

## 2018-04-26 MED ORDER — LIDOCAINE HCL (PF) 1 % IJ SOLN
INTRAMUSCULAR | Status: DC | PRN
  Administered 2018-04-26: 30 mL

## 2018-04-26 MED ORDER — CARVEDILOL 3.125 MG PO TABS
3.1250 mg | ORAL_TABLET | ORAL | Status: AC
  Administered 2018-04-26: 3.125 mg via ORAL
  Filled 2018-04-26: qty 1

## 2018-04-26 MED ORDER — FENTANYL CITRATE (PF) 100 MCG/2ML IJ SOLN
INTRAMUSCULAR | Status: DC | PRN
  Administered 2018-04-26: 25 ug via INTRAVENOUS

## 2018-04-26 MED ORDER — FENTANYL CITRATE (PF) 100 MCG/2ML IJ SOLN
25.0000 ug | INTRAMUSCULAR | Status: DC | PRN
  Administered 2018-04-26 (×2): 25 ug via INTRAVENOUS

## 2018-04-26 MED ORDER — SODIUM CHLORIDE 0.9 % IV SOLN
INTRAVENOUS | Status: DC | PRN
  Administered 2018-04-26: 500 mL

## 2018-04-26 MED ORDER — FENTANYL CITRATE (PF) 250 MCG/5ML IJ SOLN
INTRAMUSCULAR | Status: AC
  Filled 2018-04-26: qty 5

## 2018-04-26 MED ORDER — SODIUM CHLORIDE 0.9 % IV SOLN
INTRAVENOUS | Status: AC
  Filled 2018-04-26: qty 1.2

## 2018-04-26 MED ORDER — PROPOFOL 10 MG/ML IV BOLUS
INTRAVENOUS | Status: AC
  Filled 2018-04-26: qty 20

## 2018-04-26 MED ORDER — SODIUM CHLORIDE 0.9 % IV SOLN
INTRAVENOUS | Status: DC | PRN
  Administered 2018-04-26: 40 ug/min via INTRAVENOUS

## 2018-04-26 MED ORDER — LACTATED RINGERS IV SOLN
INTRAVENOUS | Status: DC
  Administered 2018-04-26: 10:00:00 via INTRAVENOUS

## 2018-04-26 MED ORDER — APIXABAN 5 MG PO TABS: 5.0000 mg | ORAL_TABLET | Freq: Two times a day (BID) | ORAL | 5 refills | Status: AC

## 2018-04-26 MED ORDER — HEMOSTATIC AGENTS (NO CHARGE) OPTIME
TOPICAL | Status: DC | PRN
  Administered 2018-04-26: 1 via TOPICAL

## 2018-04-26 MED ORDER — CHLORHEXIDINE GLUCONATE 4 % EX LIQD: 60.0000 mL | Freq: Once | CUTANEOUS | Status: DC

## 2018-04-26 MED ORDER — PROPOFOL 500 MG/50ML IV EMUL
INTRAVENOUS | Status: DC | PRN
  Administered 2018-04-26: 40 ug/kg/min via INTRAVENOUS

## 2018-04-26 MED ORDER — 0.9 % SODIUM CHLORIDE (POUR BTL) OPTIME
TOPICAL | Status: DC | PRN
  Administered 2018-04-26: 1000 mL

## 2018-04-26 MED ORDER — FENTANYL CITRATE (PF) 100 MCG/2ML IJ SOLN
INTRAMUSCULAR | Status: AC
  Filled 2018-04-26: qty 2

## 2018-04-26 MED ORDER — PROPOFOL 10 MG/ML IV BOLUS
INTRAVENOUS | Status: DC | PRN
  Administered 2018-04-26: 15 mg via INTRAVENOUS

## 2018-04-26 MED ORDER — SODIUM CHLORIDE 0.9 % IV SOLN: INTRAVENOUS | Status: DC

## 2018-04-26 MED ORDER — CEFAZOLIN SODIUM-DEXTROSE 2-4 GM/100ML-% IV SOLN
INTRAVENOUS | Status: AC
  Filled 2018-04-26: qty 100

## 2018-04-26 MED ORDER — MIDAZOLAM HCL 2 MG/2ML IJ SOLN
INTRAMUSCULAR | Status: AC
  Filled 2018-04-26: qty 2

## 2018-04-26 MED ORDER — CARVEDILOL 3.125 MG PO TABS
ORAL_TABLET | ORAL | Status: AC
  Administered 2018-04-26: 3.125 mg via ORAL
  Filled 2018-04-26: qty 1

## 2018-04-26 MED ORDER — LIDOCAINE HCL (PF) 1 % IJ SOLN
INTRAMUSCULAR | Status: AC
  Filled 2018-04-26: qty 30

## 2018-04-26 SURGICAL SUPPLY — 57 items
BANDAGE ACE 4X5 VEL STRL LF (GAUZE/BANDAGES/DRESSINGS) ×3 IMPLANT
BANDAGE ESMARK 6X9 LF (GAUZE/BANDAGES/DRESSINGS) IMPLANT
BNDG ESMARK 6X9 LF (GAUZE/BANDAGES/DRESSINGS)
CANISTER SUCT 3000ML PPV (MISCELLANEOUS) ×3 IMPLANT
CLIP VESOCCLUDE MED 24/CT (CLIP) ×3 IMPLANT
CLIP VESOCCLUDE SM WIDE 24/CT (CLIP) ×3 IMPLANT
CUFF TOURNIQUET SINGLE 24IN (TOURNIQUET CUFF) IMPLANT
CUFF TOURNIQUET SINGLE 34IN LL (TOURNIQUET CUFF) IMPLANT
CUFF TOURNIQUET SINGLE 44IN (TOURNIQUET CUFF) IMPLANT
DERMABOND ADVANCED (GAUZE/BANDAGES/DRESSINGS) ×2
DERMABOND ADVANCED .7 DNX12 (GAUZE/BANDAGES/DRESSINGS) ×1 IMPLANT
DRAIN CHANNEL 15F RND FF W/TCR (WOUND CARE) IMPLANT
DRAPE X-RAY CASS 24X20 (DRAPES) IMPLANT
DRSG TEGADERM 4X4.75 (GAUZE/BANDAGES/DRESSINGS) ×3 IMPLANT
ELECT CAUTERY BLADE 6.4 (BLADE) ×3 IMPLANT
ELECT REM PT RETURN 9FT ADLT (ELECTROSURGICAL) ×3
ELECTRODE REM PT RTRN 9FT ADLT (ELECTROSURGICAL) ×1 IMPLANT
EVACUATOR SILICONE 100CC (DRAIN) IMPLANT
GAUZE SPONGE 4X4 12PLY STRL LF (GAUZE/BANDAGES/DRESSINGS) ×3 IMPLANT
GLOVE BIOGEL PI IND STRL 6.5 (GLOVE) ×3 IMPLANT
GLOVE BIOGEL PI IND STRL 7.5 (GLOVE) ×2 IMPLANT
GLOVE BIOGEL PI INDICATOR 6.5 (GLOVE) ×6
GLOVE BIOGEL PI INDICATOR 7.5 (GLOVE) ×4
GLOVE ECLIPSE 7.0 STRL STRAW (GLOVE) ×3 IMPLANT
GLOVE SURG SS PI 7.5 STRL IVOR (GLOVE) ×3 IMPLANT
GOWN STRL REUS W/ TWL LRG LVL3 (GOWN DISPOSABLE) ×3 IMPLANT
GOWN STRL REUS W/ TWL XL LVL3 (GOWN DISPOSABLE) ×1 IMPLANT
GOWN STRL REUS W/TWL LRG LVL3 (GOWN DISPOSABLE) ×6
GOWN STRL REUS W/TWL XL LVL3 (GOWN DISPOSABLE) ×2
HEMOSTAT SNOW SURGICEL 2X4 (HEMOSTASIS) ×3 IMPLANT
KIT BASIN OR (CUSTOM PROCEDURE TRAY) ×3 IMPLANT
KIT TURNOVER KIT B (KITS) ×3 IMPLANT
MARKER GRAFT CORONARY BYPASS (MISCELLANEOUS) IMPLANT
NS IRRIG 1000ML POUR BTL (IV SOLUTION) ×3 IMPLANT
PACK CV ACCESS (CUSTOM PROCEDURE TRAY) ×3 IMPLANT
PACK PERIPHERAL VASCULAR (CUSTOM PROCEDURE TRAY) IMPLANT
PAD ARMBOARD 7.5X6 YLW CONV (MISCELLANEOUS) ×3 IMPLANT
PENCIL BUTTON HOLSTER BLD 10FT (ELECTRODE) ×3 IMPLANT
SET COLLECT BLD 21X3/4 12 (NEEDLE) IMPLANT
SPLINT FIBERGLASS 3X12 (CAST SUPPLIES) ×3 IMPLANT
STOPCOCK 4 WAY LG BORE MALE ST (IV SETS) IMPLANT
SUT ETHILON 3 0 PS 1 (SUTURE) IMPLANT
SUT PROLENE 5 0 C 1 24 (SUTURE) IMPLANT
SUT PROLENE 6 0 BV (SUTURE) ×6 IMPLANT
SUT PROLENE 7 0 BV 1 (SUTURE) IMPLANT
SUT SILK 3 0 (SUTURE)
SUT SILK 3-0 18XBRD TIE 12 (SUTURE) IMPLANT
SUT VIC AB 2-0 CT1 27 (SUTURE)
SUT VIC AB 2-0 CT1 TAPERPNT 27 (SUTURE) IMPLANT
SUT VIC AB 3-0 SH 27 (SUTURE) ×2
SUT VIC AB 3-0 SH 27X BRD (SUTURE) ×1 IMPLANT
SUT VICRYL 4-0 PS2 18IN ABS (SUTURE) ×3 IMPLANT
TOWEL GREEN STERILE (TOWEL DISPOSABLE) ×3 IMPLANT
TRAY FOLEY MTR SLVR 16FR STAT (SET/KITS/TRAYS/PACK) IMPLANT
TUBING EXTENTION W/L.L. (IV SETS) IMPLANT
UNDERPAD 30X30 (UNDERPADS AND DIAPERS) ×3 IMPLANT
WATER STERILE IRR 1000ML POUR (IV SOLUTION) ×3 IMPLANT

## 2018-04-26 NOTE — Discharge Instructions (Signed)
Wear splint as needed for protection of incision over the next 2 weeks.

## 2018-04-26 NOTE — Anesthesia Postprocedure Evaluation (Signed)
Anesthesia Post Note  Patient: Brit Biegler  Procedure(s) Performed: REPAIR RIGHT RADIAL ARTERY PSEUDOANEURYSM (Right Arm Lower)     Patient location during evaluation: PACU Anesthesia Type: MAC Level of consciousness: awake and alert Pain management: pain level controlled Vital Signs Assessment: post-procedure vital signs reviewed and stable Respiratory status: spontaneous breathing and respiratory function stable Cardiovascular status: stable Postop Assessment: no apparent nausea or vomiting Anesthetic complications: no    Last Vitals:  Vitals:   04/26/18 1350 04/26/18 1404  BP: 131/69 140/71  Pulse: 94 93  Resp: 16 17  Temp:  36.5 C  SpO2: 100% 100%    Last Pain:  Vitals:   04/26/18 1404  TempSrc:   PainSc: [place]

## 2018-04-26 NOTE — H&P (Signed)
   Patient name: [name] Rosales MRN: [id number] DOB: [birth year] Sex: female  REASON FOR VISIT:     pre-op  HISTORY OF PRESENT ILLNESS:   [name] Rosales is a 77 y.o. female with right radial artery pseudoaneurysm following heart cath.  It did not respond to pressure, and she is here today for repair.  She stopped Eliquis 2 days ago  CURRENT MEDICATIONS:    Current Facility-Administered Medications  Medication Dose Route Frequency Provider Last Rate Last Dose  . 0.9 %  sodium chloride infusion   Intravenous Continuous Nada LibmanBrabham, Camilla Skeen W, MD      . ceFAZolin (ANCEF) 2-4 GM/100ML-% IVPB           . ceFAZolin (ANCEF) IVPB 2g/100 mL premix  2 g Intravenous 30 min Pre-Op Nada LibmanBrabham, Ezri Fanguy W, MD      . chlorhexidine (HIBICLENS) 4 % liquid 4 application  60 mL Topical Once Nada LibmanBrabham, Vuk Skillern W, MD       And  . Melene Muller[START ON 04/27/2018] chlorhexidine (HIBICLENS) 4 % liquid 4 application  60 mL Topical Once Nada LibmanBrabham, Taino Maertens W, MD      . lactated ringers infusion   Intravenous Continuous Heather RobertsSinger, James, MD 10 mL/hr at 04/26/18 [phone]      REVIEW OF SYSTEMS:   [X]  denotes positive finding, [ ]  denotes negative finding Cardiac  Comments:  Chest pain or chest pressure:    Shortness of breath upon exertion:    Short of breath when lying flat:    Irregular heart rhythm:    Constitutional    Fever or chills:      PHYSICAL EXAM:   Vitals:   04/26/18 0859 04/26/18 0901 04/26/18 0934  BP: (!) 147/84    Pulse: 80    Resp: 20    Temp: 98.2 F (36.8 C)    TempSrc: Oral    SpO2:  99%   Weight: 57.2 kg  57.2 kg  Height:   5\' 6"  (1.676 m)    GENERAL: The patient is a well-nourished female, in no acute distress. The vital signs are documented above. CARDIOVASCULAR: There is a regular rate and rhythm. PULMONARY: Non-labored respirations Large right radial pseudo   MEDICAL ISSUES:   Discussed proceeding with operative repair.  All questions answered  Durene CalWells Azaylia Fong,  MD [facility] Tel [phone] Pager [phone]

## 2018-04-26 NOTE — Anesthesia Preprocedure Evaluation (Addendum)
Anesthesia Evaluation  Patient identified by MRN, date of birth, ID band Patient awake    Reviewed: Allergy & Precautions, NPO status , Patient's Chart, lab work & pertinent test results, reviewed documented beta blocker date and time   History of Anesthesia Complications Negative for: history of anesthetic complications  Airway Mallampati: II  TM Distance: >3 FB Neck ROM: Full    Dental no notable dental hx. (+) Dental Advisory Given   Pulmonary neg pulmonary ROS, former smoker,    Pulmonary exam normal        Cardiovascular hypertension, Pt. on medications and Pt. on home beta blockers + CAD, + Cardiac Stents and +CHF  Normal cardiovascular exam+ dysrhythmias Atrial Fibrillation   Impressions:  - LVEF 30-35%, normal wall thickness, severe global hypokinesis   with incoordinate septal motion, grade 2 DD with high LV filling   pressure, mild to moderate MR, severe biatrial enlargment,   moderate TR, RVSP 55 mmHg, dilated IVC, dilated coronary sinus.    Neuro/Psych  Headaches, negative psych ROS   GI/Hepatic negative GI ROS, Neg liver ROS,   Endo/Other  negative endocrine ROS  Renal/GU negative Renal ROS  negative genitourinary   Musculoskeletal negative musculoskeletal ROS (+)   Abdominal   Peds negative pediatric ROS (+)  Hematology negative hematology ROS (+)   Anesthesia Other Findings   Reproductive/Obstetrics negative OB ROS                            Anesthesia Physical Anesthesia Plan  ASA: IV  Anesthesia Plan: MAC   Post-op Pain Management:    Induction: Intravenous  PONV Risk Score and Plan: 2 and Ondansetron and Propofol infusion  Airway Management Planned: Natural Airway and Simple Face Mask  Additional Equipment:   Intra-op Plan:   Post-operative Plan:   Informed Consent: I have reviewed the patients History and Physical, chart, labs and discussed the  procedure including the risks, benefits and alternatives for the proposed anesthesia with the patient or authorized representative who has indicated his/her understanding and acceptance.   Dental advisory given  Plan Discussed with: CRNA and Anesthesiologist  Anesthesia Plan Comments:       Anesthesia Quick Evaluation

## 2018-04-26 NOTE — Op Note (Signed)
    Patient name: [name] Rosales MRN: [id number] DOB: [birth year] Sex: female  04/26/2018 Pre-operative Diagnosis: Right radial artery pseudoaneurysm Post-operative diagnosis:  Same Surgeon:  Annamarie Major Assistants: Laurence Slate Procedure:   Excision of right radial artery pseudoaneurysm and primary repair of the radial artery Anesthesia: MAC Blood Loss: Minimal  Specimens: None  Findings: Large right radial artery pseudoaneurysm.  Brisk Doppler signal in the radial artery and palmar arch after repair  Indications: The patient developed a large right radial artery pseudoaneurysm following radial artery cardiac catheterization.  This failed nonoperative measures.  It has been getting larger in size.  She comes in today for surgical repair   Procedure:  The patient was identified in the holding area and taken to [place] [facility]  The patient was then placed supine on the table. MAC anesthesia was administered.  The patient was prepped and draped in the usual sterile fashion.  A time out was called and antibiotics were administered.  1% lidocaine was used for local anesthesia.  I made an elliptical incision over top of the right radial artery pseudoaneurysm.  I then used cautery and sharp dissection to isolate the pseudoaneurysm as well as the normal caliber radial artery proximal and distal to the pseudoaneurysm.  Once a had adequate exposure, the radial artery was occluded with Serafin clamps.  I then used a #11 blade to open the pseudoaneurysm.  There was minimal thrombus.  I then used Potts scissors to excise the pseudoaneurysm down to the native radial artery.  There was a small defect within the radial artery which I closed with 2 horizontal mattress 6-0 Prolene sutures.  The clamps were then released.  There is a good Doppler signal in the radial artery and palmar arch.  The patient was on Plavix and had just recently discontinued her Eliquis.  There was a generalized ooze from all of the raw surface  areas which I addressed with Bovie cautery.  I then placed a layer of snow over top of the radial artery.  I ensure that there was good hemostasis at this time.  I then closed the incision with 2 layers of 3-0 Vicryl followed by Dermabond.  A light pressure dressing was then applied with 4 x 4 and Tegaderm.  A fiberglass splint was then created to support her hand and an Ace wrap was placed over top of the splint.  She was successfully extubated and taken to recovery room in stable condition   Disposition: To PACU stable   V. Annamarie Major, M.D. [facility] Office: [phone] Pager:  [phone]

## 2018-04-26 NOTE — Progress Notes (Signed)
Cardiac rehab order form and records fax[facility] to [facility] at [phone]

## 2018-04-26 NOTE — Transfer of Care (Signed)
Immediate Anesthesia Transfer of Care Note  Patient: [name] Rosales  Procedure(s) Performed: REPAIR RIGHT RADIAL ARTERY PSEUDOANEURYSM (Right Arm Lower)  Patient Location: PACU  Anesthesia Type:MAC  Level of Consciousness: drowsy  Airway & Oxygen Therapy: Patient Spontanous Breathing and Patient connected to face mask oxygen  Post-op Assessment: Report given to RN, Post -op Vital signs reviewed and stable and Patient moving all extremities  Post vital signs: Reviewed and stable  Last Vitals:  Vitals Value Taken Time  BP    Temp 36.3 C 04/26/2018 12:53 PM  Pulse 129 04/26/2018  1:03 PM  Resp 22 04/26/2018  1:03 PM  SpO2 88 % 04/26/2018  1:03 PM  Vitals shown include unvalidated device data.  Last Pain:  Vitals:   04/26/18 0934  TempSrc:   PainSc: 0-No pain         Complications: No apparent anesthesia complications

## 2018-04-26 NOTE — Progress Notes (Signed)
Patient's family questioning whether or not she should take her Plavix prior to surgery this am. Called MD, MD stated to hold plavix this am. Notified patient and family to hold plavix this am.

## 2018-04-27 ENCOUNTER — Telehealth: Payer: Self-pay

## 2018-04-27 ENCOUNTER — Telehealth: Payer: Self-pay | Admitting: Surgery

## 2018-04-27 ENCOUNTER — Encounter (HOSPITAL_COMMUNITY): Payer: Self-pay | Admitting: Surgery

## 2018-04-27 MED ORDER — NORTRIPTYLINE HCL 10 MG PO CAPS: 10.0000 mg | ORAL_CAPSULE | Freq: Every day | ORAL | 3 refills | Status: DC

## 2018-04-27 NOTE — Telephone Encounter (Signed)
-----   Message from Drema DallasAdam R Jaffe, DO sent at 04/26/2018  7:30 PM EDT ----- I would like to prescribe this patient nortriptyline 10mg  at bedtime for headache.  If headaches not improved in 4 weeks, she is to contact us and we can increase dose to 25mg  at bedtime.  Please let her know that Dr. Gala RomneyBensimhon is okay with starting this medication.

## 2018-04-27 NOTE — Telephone Encounter (Signed)
Called and spoke with son [name], he is not on DPR. I asked him to have Pt return call. I am sending Rx to pharmacy.

## 2018-04-27 NOTE — Telephone Encounter (Signed)
sch appt spk to pt son 05/15/18 1pm p/o MD

## 2018-04-28 ENCOUNTER — Encounter (HOSPITAL_COMMUNITY): Payer: Self-pay

## 2018-04-28 NOTE — Progress Notes (Signed)
FMLA on behalf of primary caregiver completed and faxed with supporting documentation to provided # [phone] Copy of forms scanned into patient's electronic medical record under media tab for reference.  Ave FilterBradley, Megan Genevea, RN

## 2018-05-02 ENCOUNTER — Telehealth: Payer: Self-pay | Admitting: Neurology

## 2018-05-02 NOTE — Telephone Encounter (Signed)
From last phone note:   I would like to prescribe this patient nortriptyline 10mg  at bedtime for headache.  If headaches not improved in 4 weeks, she is to contact us and we can increase dose to 25mg  at bedtime.  Please let her know that Dr. Gala Romney is okay with starting this medication.  Andrey Campanile was waiting for patient to call back to inform her of this information. Called patient back and let her know information.

## 2018-05-02 NOTE — Telephone Encounter (Signed)
Patient is returned [place] call

## 2018-05-04 ENCOUNTER — Telehealth: Payer: Self-pay

## 2018-05-04 NOTE — Telephone Encounter (Addendum)
Rcvd VM from Pt's son, Jeral Fruit. Pt has not rcvd nortriptyline, needs PA. I submitted on cover my meds. KEY: [id number] I called Annette Stable [phone], and advised him to check with the pharmacy tomorrow.   PA Case: [id number], Status: Approved, Coverage Starts on: 05/04/2018 12:00:00 AM, Coverage Ends on: 05/03/2020  Called [facility] in [place], spoke with Clovis Riley.

## 2018-05-05 ENCOUNTER — Other Ambulatory Visit (HOSPITAL_COMMUNITY): Payer: Self-pay | Admitting: *Deleted

## 2018-05-05 ENCOUNTER — Telehealth (HOSPITAL_COMMUNITY): Payer: Self-pay | Admitting: *Deleted

## 2018-05-05 DIAGNOSIS — I480 Paroxysmal atrial fibrillation: Secondary | ICD-10-CM

## 2018-05-05 NOTE — Telephone Encounter (Signed)
Per Dr Gala Romney pt needs TEE and DCCV.  Spoke w/pt's daughter [name] to sch, pt is sch for 05/19/18 at 11:15, all instructions reviewed w/Cheryl via phone.

## 2018-05-08 ENCOUNTER — Telehealth: Payer: Self-pay | Admitting: Neurology

## 2018-05-08 NOTE — Telephone Encounter (Signed)
Venlafaxine XR 37.5mg  daily.  We can increase dose in 4 weeks if needed.

## 2018-05-08 NOTE — Telephone Encounter (Signed)
Called and spoke with [place]. Pt unable to tolerate nortriptyline. They would like your recommendation on another medication

## 2018-05-08 NOTE — Telephone Encounter (Signed)
[name] Rosales called regarding [name] Rosales and her Nortriptyline medication. She started her first dose on Friday night and she was very antsy and agitated as well as unable to sleep. Please Call. Thanks

## 2018-05-09 ENCOUNTER — Telehealth: Payer: Self-pay | Admitting: Neurology

## 2018-05-09 MED ORDER — VENLAFAXINE HCL ER 37.5 MG PO CP24: 37.5000 mg | ORAL_CAPSULE | Freq: Every day | ORAL | 3 refills | Status: AC

## 2018-05-09 NOTE — Telephone Encounter (Signed)
Called and spoke with Caitlyn. She was questioning if we were aware of venlafaxine and amiodarone interaction. Advised her Dr Everlena Cooper is aware.

## 2018-05-09 NOTE — Telephone Encounter (Signed)
Caitlyn called from [facility] wanting to speak with a nurse about a drug interaction on this patient. Please call her back at [phone]. Thanks!

## 2018-05-09 NOTE — Addendum Note (Signed)
Addended by: Dorthy Cooler on: 05/09/2018 11:19 AM   Modules accepted: Orders

## 2018-05-09 NOTE — Telephone Encounter (Signed)
[name] Rosales returned call, advised her of venlafaxine and to call in 4 weeks if needed. Confirmed pharmacy

## 2018-05-09 NOTE — Telephone Encounter (Signed)
LMOVM for [name] Rosales to return call

## 2018-05-15 ENCOUNTER — Encounter: Payer: Self-pay | Admitting: Surgery

## 2018-05-15 ENCOUNTER — Ambulatory Visit (INDEPENDENT_AMBULATORY_CARE_PROVIDER_SITE_OTHER): Payer: Medicare PPO | Admitting: Surgery

## 2018-05-15 ENCOUNTER — Other Ambulatory Visit: Payer: Self-pay

## 2018-05-15 VITALS — BP 127/83 | HR 99 | Temp 97.5°F | Resp 16 | Ht 66.0 in | Wt 128.6 lb

## 2018-05-15 DIAGNOSIS — I721 Aneurysm of artery of upper extremity: Secondary | ICD-10-CM

## 2018-05-15 NOTE — Progress Notes (Signed)
Patient name: [name] Rosales MRN: [id number] DOB: 09-25-1940 Sex: female  REASON FOR VISIT:     post op  HISTORY OF PRESENT ILLNESS:   [name] Rosales is a [age] y.o. female who returns today for follow-up.  On 04/26/2018 she underwent repair of a right radial artery pseudoaneurysm.  She has no complaints today.  She is back on all of her antiplatelet and anticoagulation medication.  CURRENT MEDICATIONS:    Current Outpatient Medications  Medication Sig Dispense Refill  . acetaminophen (TYLENOL) 500 MG tablet Take 1,000 mg by mouth every 6 (six) hours as needed for moderate pain or headache.     Marland Kitchen. amiodarone (PACERONE) 200 MG tablet Take 1 tablet (200 mg total) by mouth 2 (two) times daily. On 04/08/2018, reduce to 1 tablet (200mg ) once daily. (Patient taking differently: Take 200 mg by mouth daily. ) 60 tablet 5  . apixaban (ELIQUIS) 5 MG TABS tablet Take 1 tablet (5 mg total) by mouth 2 (two) times daily. 60 tablet 5  . aspirin 81 MG chewable tablet Chew 1 tablet (81 mg total) by mouth daily. STOP TAKING on 04/27/2018.    Marland Kitchen. atorvastatin (LIPITOR) 80 MG tablet Take 1 tablet (80 mg total) by mouth daily at 6 PM. 30 tablet 5  . calcium carbonate (TUMS - DOSED IN MG ELEMENTAL CALCIUM) 500 MG chewable tablet Chew 1 tablet by mouth daily as needed for indigestion or heartburn.     . carvedilol (COREG) 3.125 MG tablet Take 1 tablet (3.125 mg total) by mouth 2 (two) times daily. 60 tablet 6  . clopidogrel (PLAVIX) 75 MG tablet Take 1 tablet (75 mg total) by mouth daily with breakfast. 30 tablet 5  . hydrALAZINE (APRESOLINE) 25 MG tablet Take 1 tablet (25 mg total) by mouth every 8 (eight) hours. 90 tablet 11  . hydrocortisone cream 1 % Apply topically 2 (two) times daily. (Patient taking differently: Apply 1 application topically 2 (two) times daily. ) 30 g 0  . multivitamin-lutein (OCUVITE-LUTEIN) CAPS capsule Take 1 capsule by mouth daily.    . nitroGLYCERIN  (NITROSTAT) 0.4 MG SL tablet Place 1 tablet (0.4 mg total) under the tongue every 5 (five) minutes as needed for chest pain. 25 tablet 2  . phenytoin (DILANTIN) 100 MG ER capsule Take 1-2 capsules (100-200 mg total) by mouth See admin instructions. Take 200 mg every morning  Take 100 mg midday (12 pm) Take 100 mg at bedtime    . sacubitril-valsartan (ENTRESTO) 49-51 MG Take 1 tablet by mouth 2 (two) times daily.    Marland Kitchen. spironolactone (ALDACTONE) 25 MG tablet Take 1 tablet (25 mg total) by mouth daily.    Marland Kitchen. torsemide (DEMADEX) 20 MG tablet Take 2 tablets (40 mg total) by mouth daily as needed (For weight greater than 128 lbs). 30 tablet 11  . traMADol (ULTRAM) 50 MG tablet Take 1 tablet (50 mg total) by mouth 3 (three) times daily as needed. (Patient taking differently: Take 50 mg by mouth 3 (three) times daily as needed for moderate pain. ) 30 tablet 0  . venlafaxine XR (EFFEXOR XR) 37.5 MG 24 hr capsule Take 1 capsule (37.5 mg total) by mouth daily. 30 capsule 3   No current facility-administered medications for this visit.     REVIEW OF SYSTEMS:   [X]  denotes positive finding, [ ]  denotes negative finding Cardiac  Comments:  Chest pain or chest pressure:    Shortness of breath upon exertion:    Short of breath when  lying flat:    Irregular heart rhythm:    Constitutional    Fever or chills:      PHYSICAL EXAM:   Vitals:   05/15/18 1251  BP: 127/83  Pulse: 99  Resp: 16  Temp: (!) 97.5 F (36.4 C)  TempSrc: Oral  SpO2: 94%  Weight: 128 lb 9.6 oz (58.3 kg)  Height: 5\' 6"  (1.676 m)    GENERAL: The patient is a well-nourished female, in no acute distress. The vital signs are documented above. CARDIOVASCULAR: There is a regular rate and rhythm. PULMONARY: Non-labored respirations Incision has healed nicely.  There is no evidence of hematoma.  There is no evidence of infection There is a palpable radial pulse proximal and distal to the repair.  The hand is warm and  well-perfused  STUDIES:   None   MEDICAL ISSUES:   Status post primary repair of right radial artery pseudoaneurysm secondary to cardiac catheterization.  She has recovered nicely.  No ongoing vascular issues.  She will follow-up as needed.  Durene Cal, MD [facility] [phone] Pager [phone]

## 2018-05-16 ENCOUNTER — Encounter (HOSPITAL_COMMUNITY): Payer: Self-pay

## 2018-05-16 NOTE — Progress Notes (Signed)
Intermittent FMLA completed on behalf of primary caregiver (daughter-Melinda) and faxed to provided # [phone] attn: Marry GuanPatricia Calloway (6 total pages). Copy of forms scanned into patient's electronic medical record under media tab for reference.  Ave FilterBradley, Altha Sweitzer Genevea, RN

## 2018-05-19 ENCOUNTER — Ambulatory Visit (HOSPITAL_COMMUNITY): Admission: RE | Admit: 2018-05-19 | Payer: Medicare PPO | Source: Ambulatory Visit | Admitting: Internal Medicine

## 2018-05-19 ENCOUNTER — Encounter (HOSPITAL_COMMUNITY): Admission: RE | Payer: Self-pay | Source: Ambulatory Visit

## 2018-05-19 SURGERY — ECHOCARDIOGRAM, TRANSESOPHAGEAL
Anesthesia: Moderate Sedation

## 2018-05-24 ENCOUNTER — Ambulatory Visit (INDEPENDENT_AMBULATORY_CARE_PROVIDER_SITE_OTHER): Payer: Self-pay | Admitting: Vascular Surgery

## 2018-05-24 ENCOUNTER — Other Ambulatory Visit: Payer: Self-pay

## 2018-05-24 ENCOUNTER — Encounter: Payer: Self-pay | Admitting: Vascular Surgery

## 2018-05-24 VITALS — BP 119/73 | HR 94 | Temp 97.0°F | Resp 18 | Ht 66.0 in | Wt 128.1 lb

## 2018-05-24 DIAGNOSIS — I721 Aneurysm of artery of upper extremity: Secondary | ICD-10-CM

## 2018-05-24 MED ORDER — CEPHALEXIN 500 MG PO CAPS: 500.0000 mg | ORAL_CAPSULE | Freq: Three times a day (TID) | ORAL | 0 refills | Status: AC

## 2018-05-24 NOTE — Progress Notes (Signed)
Patient is a 77 year old female who presents to the office today with some drainage and irritation from a previous incision for repair of right radial artery pseudoaneurysm.  The patient denies any fever or chills.  She has some tenderness at the area of the incision and it has been swelling over the last few days becoming more red and inflamed.  Physical exam:  Right wrist linear incision well-healed except for the distal 1 cm where there is a nodule about 6 x 6 mm diameter erythematous and fluctuant this was open slightly with an 18-gauge needle today and about 2 cc of yellowish purulent material expressed  Assessment: Suture material abscess distal portion of right radial artery incision  Plan: Local I&D of this was performed with the needle today hopefully this will provide adequate drainage.  If it recurs it would need a more formal I&D.  I also placed her on Keflex 500 mg 3 times daily.  She will follow-up with Dr. Myra Gianotti in 2 weeks if it has not resolved.  If it has completely resolved in 2 weeks she will follow-up on an as-needed basis.  Fabienne Bruns, MD [facility] [place] Office: [phone] Pager: [phone]

## 2018-05-29 ENCOUNTER — Other Ambulatory Visit: Payer: Self-pay | Admitting: Student

## 2018-05-29 ENCOUNTER — Telehealth (HOSPITAL_COMMUNITY): Payer: Self-pay | Admitting: Cardiology

## 2018-05-29 ENCOUNTER — Ambulatory Visit (HOSPITAL_COMMUNITY)
Admission: RE | Admit: 2018-05-29 | Discharge: 2018-05-29 | Disposition: A | Discharge status: Home / Self Care | Payer: Medicare PPO | Source: Ambulatory Visit | Attending: Internal Medicine | Admitting: Internal Medicine

## 2018-05-29 VITALS — BP 130/86 | HR 89 | Wt 134.0 lb

## 2018-05-29 DIAGNOSIS — R21 Rash and other nonspecific skin eruption: Secondary | ICD-10-CM | POA: Insufficient documentation

## 2018-05-29 DIAGNOSIS — G43909 Migraine, unspecified, not intractable, without status migrainosus: Secondary | ICD-10-CM | POA: Diagnosis not present

## 2018-05-29 DIAGNOSIS — I480 Paroxysmal atrial fibrillation: Secondary | ICD-10-CM | POA: Insufficient documentation

## 2018-05-29 DIAGNOSIS — Z7902 Long term (current) use of antithrombotics/antiplatelets: Secondary | ICD-10-CM | POA: Insufficient documentation

## 2018-05-29 DIAGNOSIS — Z8249 Family history of ischemic heart disease and other diseases of the circulatory system: Secondary | ICD-10-CM | POA: Diagnosis not present

## 2018-05-29 DIAGNOSIS — Z87891 Personal history of nicotine dependence: Secondary | ICD-10-CM | POA: Insufficient documentation

## 2018-05-29 DIAGNOSIS — G4700 Insomnia, unspecified: Secondary | ICD-10-CM | POA: Diagnosis not present

## 2018-05-29 DIAGNOSIS — I4892 Unspecified atrial flutter: Secondary | ICD-10-CM | POA: Insufficient documentation

## 2018-05-29 DIAGNOSIS — N179 Acute kidney failure, unspecified: Secondary | ICD-10-CM | POA: Diagnosis not present

## 2018-05-29 DIAGNOSIS — I671 Cerebral aneurysm, nonruptured: Secondary | ICD-10-CM | POA: Diagnosis not present

## 2018-05-29 DIAGNOSIS — I2510 Atherosclerotic heart disease of native coronary artery without angina pectoris: Secondary | ICD-10-CM | POA: Diagnosis not present

## 2018-05-29 DIAGNOSIS — F419 Anxiety disorder, unspecified: Secondary | ICD-10-CM | POA: Insufficient documentation

## 2018-05-29 DIAGNOSIS — G253 Myoclonus: Secondary | ICD-10-CM | POA: Diagnosis not present

## 2018-05-29 DIAGNOSIS — I447 Left bundle-branch block, unspecified: Secondary | ICD-10-CM | POA: Insufficient documentation

## 2018-05-29 DIAGNOSIS — Z79899 Other long term (current) drug therapy: Secondary | ICD-10-CM | POA: Diagnosis not present

## 2018-05-29 DIAGNOSIS — Z7982 Long term (current) use of aspirin: Secondary | ICD-10-CM | POA: Insufficient documentation

## 2018-05-29 DIAGNOSIS — E871 Hypo-osmolality and hyponatremia: Secondary | ICD-10-CM | POA: Insufficient documentation

## 2018-05-29 DIAGNOSIS — G4733 Obstructive sleep apnea (adult) (pediatric): Secondary | ICD-10-CM | POA: Diagnosis not present

## 2018-05-29 DIAGNOSIS — I110 Hypertensive heart disease with heart failure: Secondary | ICD-10-CM | POA: Insufficient documentation

## 2018-05-29 DIAGNOSIS — I5082 Biventricular heart failure: Secondary | ICD-10-CM | POA: Diagnosis not present

## 2018-05-29 DIAGNOSIS — F5101 Primary insomnia: Secondary | ICD-10-CM | POA: Diagnosis not present

## 2018-05-29 DIAGNOSIS — Z7901 Long term (current) use of anticoagulants: Secondary | ICD-10-CM | POA: Insufficient documentation

## 2018-05-29 DIAGNOSIS — I5023 Acute on chronic systolic (congestive) heart failure: Secondary | ICD-10-CM | POA: Diagnosis present

## 2018-05-29 DIAGNOSIS — Z955 Presence of coronary angioplasty implant and graft: Secondary | ICD-10-CM | POA: Insufficient documentation

## 2018-05-29 LAB — COMPREHENSIVE METABOLIC PANEL
ALT: 27 U/L (ref 0–44)
ANION GAP: 12 (ref 5–15)
AST: 52 U/L — ABNORMAL HIGH (ref 15–41)
Albumin: 3.6 g/dL (ref 3.5–5.0)
Alkaline Phosphatase: 131 U/L — ABNORMAL HIGH (ref 38–126)
BUN: 25 mg/dL — ABNORMAL HIGH (ref 8–23)
CO2: 23 mmol/L (ref 22–32)
Calcium: 8.7 mg/dL — ABNORMAL LOW (ref 8.9–10.3)
Chloride: 89 mmol/L — ABNORMAL LOW (ref 98–111)
Creatinine, Ser: 1.09 mg/dL — ABNORMAL HIGH (ref 0.44–1.00)
GFR calc non Af Amer: 48 mL/min — ABNORMAL LOW (ref >60)
GFR, EST AFRICAN AMERICAN: 55 mL/min — AB (ref >60)
Glucose, Bld: 109 mg/dL — ABNORMAL HIGH (ref 70–99)
POTASSIUM: 3.8 mmol/L (ref 3.5–5.1)
SODIUM: 124 mmol/L — AB (ref 135–145)
Total Bilirubin: 0.7 mg/dL (ref 0.3–1.2)
Total Protein: 7.5 g/dL (ref 6.5–8.1)

## 2018-05-29 LAB — CBC
HCT: 30.1 % — ABNORMAL LOW (ref 36.0–46.0)
Hemoglobin: 9.9 g/dL — ABNORMAL LOW (ref 12.0–15.0)
MCH: 33.3 pg (ref 26.0–34.0)
MCHC: 32.9 g/dL (ref 30.0–36.0)
MCV: 101.3 fL — ABNORMAL HIGH (ref 78.0–100.0)
PLATELETS: 194 10*3/uL (ref 150–400)
RBC: 2.97 MIL/uL — AB (ref 3.87–5.11)
RDW: 14.4 % (ref 11.5–15.5)
WBC: 3.8 10*3/uL — ABNORMAL LOW (ref 4.0–10.5)

## 2018-05-29 LAB — TSH: TSH: 4.055 u[IU]/mL (ref 0.350–4.500)

## 2018-05-29 MED ORDER — TEMAZEPAM 15 MG PO CAPS: ORAL_CAPSULE | ORAL | 1 refills | Status: AC

## 2018-05-29 MED ORDER — TORSEMIDE 20 MG PO TABS: 40.0000 mg | ORAL_TABLET | Freq: Every day | ORAL | 11 refills | Status: AC

## 2018-05-29 NOTE — Progress Notes (Signed)
.   Advanced Heart Failure Clinic Note     HPI:  [name] Rosales is a 77 y.o. female  (mother of Cory Munch) with h/o HTN and recently diagnosed systolic HF presents as a new patient for further evaluation of recent-onset systolic HF.   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 Has long h/o HTN. Was fine until April 2019 when she developed severe ankle swelling followed by exertional dyspnea.  12/2017 May she developed respiratory failure with orthopnea and PND.  Went to urgent care and transferred to the [facility].On arrival SBP was > 200. Echo as below showed EF ~40% with 2-3+ MR. Inferior HK. She was diuresed about 13 pounds. Also found to have OSA with AHI 23 started on CPAP. Renal artery u/s without evidence of RAS.  Admitted from HF clinic 03/24/18 with worsening functional status and marked volume overload. Diuresed with IV lasix. Echo showed EF 30-35% as below. Taken for [facility] as below which showed 90% RCA stenosis. Treated with PCI and DES by Dr. Tamala Julian. Developed Afib post procedure leading to initiation of Eliquis 5 mg BID and amiodarone. She was in and out of Afib but ultimately converted to NSR on amiodarone.  Hospital course additionally complicated by uncontrollable shaking/tremors which led to Neuro consult. MRI as below. Work up further revealed positive ASA and SSA (Ro) and SSB (La) prompting Rheumoatology referral as outpatient. Discharge weight 126 lbs, though creatinine had trended up to 1.87. She was in NSR when she left on 04/01/18.  Post cath course c/b radial artery pseudoaneurysm and underwent operative repair. Had wound infection and was started on Keflex.   Saw Dr. Amil Amen in Rheumatology who felt that symptoms had resolved and no clear rheumatologic diagnosis currently.   Also saw Dr. Tomi Likens in Neurology about her migraines and suggested dilantin change to Effexor. But this has not been done yet.    Saw her Pulmonary doc and CPAP switched to Bipap.   She  presents today with her daughter and son for acute work-in. Says she was much improved after finishing steroid course about a month ago (04/15/18). Over past 2 weeks feeling miserable. Unable to sleep for the past week. Extremely fatigued. Also has worsening swelling but struggles with torsemide as it causes her to be wiped out. Wears BIPAP and says she is not waking up due to SOB. No orthopnea or PND. Just agitated. Yesterday we restarted steroids given possibility of CTD flare. Baseline weight about 124. Was up to 131 yesterday and now 129. + LE edema. No fevers, chills or rash. SBP 106-136. Holding hydralazine at times.    EKG today shows Atrial flutter 106 bpm. (Personally reviewed)  Echo 03/25/18 LVEF of 30-35%, severe HK with incoordinate, septal motion, grade 2 DD, Mild/Mod MR, Severe BAE, Moderate TR.   PCI 03/27/18 - Successful mid RCA PCI reducing greater than 95% stenosis with TIMI-3 flow to to 0% with TIMI grade III flow.  Final post dilatation pressure was 14 atm using the device balloon.  R/LHC 03/27/18  Mid RCA lesion is 90% stenosed.  Prox LAD to Mid LAD lesion is 20% stenosed. Findings: RA = 5 RV = 58/8 PA = 56/18 (34) PCW = 16 (v = 25) Fick cardiac output/index = 6.8/4.0 PVR =2.7 WU FA sat = 98% PA sat = 77%, 79% SVC sat = 75%   Studies: 12/2017 [facility]) The left ventricular ejection fraction is moderately reduced (~40%) with inferior HK  Flattened septum is consistent with right ventricular pressure  overload.  There is normal left ventricular wall thickness.  The aortic valve is trileaflet with thin, pliable leaflets that move  normally.  There is moderate to moderately severe (2-3+) mitral regurgitation.  Septal motion is consistent with conduction abnormality.  The left atrium is severely dilated.  The right atrium is severely dilated.  There is moderate (2+) tricuspid regurgitation.  Right ventricular systolic pressure is elevated between 40-65m Hg,    consistent with moderate pulmonary hypertension.  There is a small size pericardial effusion.   Review of systems complete and found to be negative unless listed in HPI.    PMHx: 1. HTN 2. Systolic HF  Current Outpatient Medications  Medication Sig Dispense Refill  . acetaminophen (TYLENOL) 500 MG tablet Take 1,000 mg by mouth every 6 (six) hours as needed for moderate pain or headache.     .Marland Kitchenamiodarone (PACERONE) 200 MG tablet Take 1 tablet (200 mg total) by mouth 2 (two) times daily. On 04/08/2018, reduce to 1 tablet (2090m once daily. 60 tablet 5  . apixaban (ELIQUIS) 5 MG TABS tablet Take 1 tablet (5 mg total) by mouth 2 (two) times daily. 60 tablet 5  . aspirin 81 MG chewable tablet Chew 1 tablet (81 mg total) by mouth daily. STOP TAKING on 04/27/2018. (Patient not taking: Reported on 05/24/2018)    . atorvastatin (LIPITOR) 80 MG tablet Take 1 tablet (80 mg total) by mouth daily at 6 PM. 30 tablet 5  . calcium carbonate (TUMS - DOSED IN MG ELEMENTAL CALCIUM) 500 MG chewable tablet Chew 1 tablet by mouth daily as needed for indigestion or heartburn.     . carvedilol (COREG) 3.125 MG tablet Take 1 tablet (3.125 mg total) by mouth 2 (two) times daily. 60 tablet 6  . cephALEXin (KEFLEX) 500 MG capsule Take 1 capsule (500 mg total) by mouth 3 (three) times daily. 21 capsule 0  . clopidogrel (PLAVIX) 75 MG tablet Take 1 tablet (75 mg total) by mouth daily with breakfast. 30 tablet 5  . hydrALAZINE (APRESOLINE) 25 MG tablet Take 1 tablet (25 mg total) by mouth every 8 (eight) hours. 90 tablet 11  . hydrocortisone cream 1 % Apply topically 2 (two) times daily. (Patient not taking: Reported on 05/24/2018) 30 g 0  . multivitamin-lutein (OCUVITE-LUTEIN) CAPS capsule Take 1 capsule by mouth daily.    . nitroGLYCERIN (NITROSTAT) 0.4 MG SL tablet Place 1 tablet (0.4 mg total) under the tongue every 5 (five) minutes as needed for chest pain. 25 tablet 2  . phenytoin (DILANTIN) 100 MG ER capsule Take 1-2  capsules (100-200 mg total) by mouth See admin instructions. Take 200 mg every morning  Take 100 mg midday (12 pm) Take 100 mg at bedtime    . sacubitril-valsartan (ENTRESTO) 49-51 MG Take 1 tablet by mouth 2 (two) times daily.    . Marland Kitchenpironolactone (ALDACTONE) 25 MG tablet Take 1 tablet (25 mg total) by mouth daily.    . Marland Kitchenorsemide (DEMADEX) 20 MG tablet Take 2 tablets (40 mg total) by mouth daily as needed (For weight greater than 128 lbs). 30 tablet 11  . traMADol (ULTRAM) 50 MG tablet Take 1 tablet (50 mg total) by mouth 3 (three) times daily as needed. (Patient not taking: Reported on 05/24/2018) 30 tablet 0  . venlafaxine XR (EFFEXOR XR) 37.5 MG 24 hr capsule Take 1 capsule (37.5 mg total) by mouth daily. 30 capsule 3   No current facility-administered medications for this encounter.     No  Known Allergies    Social History   Socioeconomic History  . Marital status: Single    Spouse name: Not on file  . Number of children: 4  . Years of education: Not on file  . Highest education level: 12th grade  Occupational History  . Occupation: retired  Scientific laboratory technician  . Financial resource strain: Not on file  . Food insecurity:    Worry: Not on file    Inability: Not on file  . Transportation needs:    Medical: Not on file    Non-medical: Not on file  Tobacco Use  . Smoking status: Former Smoker    Types: Cigarettes  . Smokeless tobacco: Never Used  . Tobacco comment: quit smoking > 25 years ago  Substance and Sexual Activity  . Alcohol use: Not Currently  . Drug use: Never  . Sexual activity: Not on file  Lifestyle  . Physical activity:    Days per week: Not on file    Minutes per session: Not on file  . Stress: Not on file  Relationships  . Social connections:    Talks on phone: Not on file    Gets together: Not on file    Attends religious service: Not on file    Active member of club or organization: Not on file    Attends meetings of clubs or organizations: Not on file     Relationship status: Not on file  . Intimate partner violence:    Fear of current or ex partner: Not on file    Emotionally abused: Not on file    Physically abused: Not on file    Forced sexual activity: Not on file  Other Topics Concern  . Not on file  Social History Narrative   Patient is right-handed. She lives alone in a split level home. She occasionally drinks coffee. She has been going to P/T, she was walking and riding her stationary bike, has been unable to recently.    FHx: Father died from [place] Mother had HTN. Died at 54 Grandmother had HF No family h/o premature CAD  There were no vitals filed for this visit. Wt Readings from Last 3 Encounters:  05/24/18 58.1 kg (128 lb 1.6 oz)  05/15/18 58.3 kg (128 lb 9.6 oz)  04/26/18 57.2 kg (126 lb 2 oz)    PHYSICAL EXAM: General:  Fatigued appearing. No resp difficulty HEENT: normal Neck: supple. JVP to jaw with prominent v waves  Carotids 2+ bilat; no bruits. No lymphadenopathy or thryomegaly appreciated. Cor: PMI nondisplaced. Irregular rate & rhythm. No rubs, gallops or murmurs. Lungs: clear Abdomen: soft, nontender, nondistended. No hepatosplenomegaly. No bruits or masses. Good bowel sounds. Extremities: no cyanosis, clubbing, rash, 2+ edema Neuro: alert & orientedx3, cranial nerves grossly intact. moves all 4 extremities w/o difficulty. Affect pleasant  ECG AFL 91 LBBB Personally reviewed   ASSESSMENT & PLAN: 1. 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 Differential also includes ischemic CM, infiltrative CM (amyloid) and CTD-related PAH/HF. Admitted with massive volume overload and NYHA IIIB-IV symptoms.  cMRI 03/28/18 with LVEF 40% and no LGE. Echo 03/25/18 EF 35%.   - She's worse today. NYHA IIIB. Volume status elevated. We discussed possible admission but she would  like to hold off for now. Will have her take torsemide daily for next 2 days and if no better will admit  her on Wednesday  - Her fatigued seems out of proportion to her HF and I am worried she has another systemic problem in the background but w/u so far has been negative. Will continue systemic steroids for now. Also start temazepam 46m qhs prn to help with insomnia.  - Continue Entresto to 97/103 bid.   - Continue  carvedilol 3.125 bid - Continue spiro 25 mg daily - Continue hydralazine 25 tid. Can wean off as needed. Keep SBP > 110 - Reinforced fluid restriction to < 2 L daily, sodium restriction to less than 2000 mg daily, and the importance of daily weights.   2. Severe HTN:  - BP improved. Meds as above.  3. CAD:  - 03/27/18 L/RHC showed 1v CAD with high grade mRCA stenosis (otherwise non-obstructive CAD) with mild pulmonary HTN with prominent V waves suggestive MR vs significant diastolic dysfunction.  - No s/s ischemia - Continue Plavix and Eliquis.  Off ASA - Will need plavix for at least 6 months. 4. PAF/AFL , new onset:  - She has had both atrial fib and flutter. She remains in AFL today. Rate ok .  - She is now on Eliquis.  - Continue amio 200 daily. Will need TEE/DC-CV in near future 5. Facial rash/?possible lups:  - Reviewed with Dermatology during recent admission. 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 + Ro and La antibodies suggestive of possible Sjogrens.  P-ANCA initially elevated to 1:80. Negative on recheck dsDNA negative - I discussed with Dr. BAmil Amenin Rheum. We placed her on steroid taper previously and she responded dramatically. Dr. BAmil Amensaw her in f/u and he felt that symptoms had resolved and no clear rheumatologic diagnosis currently.  - With worsening symptoms she is now back on prednisone. We will gauge response  6.  LBBB:  - Consider CRT depending on LV EF in future. No change.  7. OSA:  - Continue CPAP qhs. No change.  8. Migraines:  - Seen by Dr. JTomi Likensand Dilantin changed to Effexor 9. Hyponatremia:  - Chronic. Na 125 at discharge. Most recent was 129. Recheck today.  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 DDx is broad but includes phenytoin-induced chorea and hyponatremia. MRI brain 03/28/18 with acute occipital infarct and chronic microvascular changes in setting of atrial fibrillation. Heavy metals negative. IgG + at > 1800. (Normal [phone] mg/dL). CT Chest/Abdomen/Pelvis 03/29/18 with no evidence for malignancy. ? Worsened by fatigue and anxiety - Seen by Dr. JTomi Likensand Dilantin changed to Effexor but she has not made the change yet,  12. ICA aneurysm: - Incidental finding. Felt to be small.  - Will need repeat CTA in February 2020. No change.  13. AKI: - resolved 14. Psuedoaneurysm at right radial artery cath site - s/p repair 04/26/18. Now on keflex for post-op wound 15. Insomnia - trial of temazepam 15 mg qhs.  Total time spent 45 minutes. Over half that time spent discussing above.   DGlori Bickers MD  6:02 PM

## 2018-05-29 NOTE — Patient Instructions (Addendum)
RESTART Torsemide 40 mg, (two tabs) daily  START Temazepam 15 mg, one or two capsules at bedtime as needed for sleep  Labs today We will only contact you if something comes back abnormal or we need to make some changes. Otherwise no news is good news!  Your physician recommends that you schedule a follow-up appointment in: 2-3 weeks with Dr Gala Romney

## 2018-05-29 NOTE — Telephone Encounter (Signed)
Verbal referral given to [facility] at home intake department [phone]) Per Dr Gala Romney patient will need [facility] services for chf management and nurse aide.  Spoke with Olegario Messier, insurance is in network and referral sent to the [place] office. Nothing further needed at this time.

## 2018-05-30 LAB — ANA: ANA: POSITIVE — AB

## 2018-05-30 LAB — T4: T4, Total: 7.3 ug/dL (ref 4.5–12.0)

## 2018-05-30 LAB — T3: T3 TOTAL: 70 ng/dL — AB (ref 71–180)

## 2018-05-30 MED ORDER — SACUBITRIL-VALSARTAN 97-103 MG PO TABS: 1.0000 | ORAL_TABLET | Freq: Two times a day (BID) | ORAL | 5 refills | Status: DC

## 2018-05-31 ENCOUNTER — Telehealth (HOSPITAL_COMMUNITY): Payer: Self-pay | Admitting: Pharmacist

## 2018-05-31 NOTE — Telephone Encounter (Signed)
Temazepam PA approved by [facility] through 06/01/19.   Tyler Deis. Bonnye Fava, PharmD, BCPS, CPP Clinical Pharmacist Phone: [phone] 05/31/2018 4:05 PM

## 2018-06-01 ENCOUNTER — Other Ambulatory Visit (HOSPITAL_COMMUNITY): Payer: Self-pay | Admitting: *Deleted

## 2018-06-01 MED ORDER — SACUBITRIL-VALSARTAN 49-51 MG PO TABS: 1.0000 | ORAL_TABLET | Freq: Two times a day (BID) | ORAL | 6 refills | Status: AC

## 2018-06-02 ENCOUNTER — Telehealth (HOSPITAL_COMMUNITY): Payer: Self-pay

## 2018-06-02 NOTE — Telephone Encounter (Signed)
[name] Rosales from [facility] called to state that pt is getting cardiac rehab in [place]. Pt's insurance will not cover cardiac rehab and In home care from [place]. Pt wants to stay with cardiac rehab because she finds it more beneficial.

## 2018-06-08 ENCOUNTER — Ambulatory Visit: Payer: Medicare PPO | Admitting: Vascular Surgery

## 2018-06-19 ENCOUNTER — Inpatient Hospital Stay (HOSPITAL_COMMUNITY)
Admission: AD | Admit: 2018-06-19 | Discharge: 2018-06-30 | DRG: 871 | Disposition: E | Payer: Medicare PPO | Source: Other Acute Inpatient Hospital | Attending: Internal Medicine | Admitting: Internal Medicine

## 2018-06-19 ENCOUNTER — Inpatient Hospital Stay (HOSPITAL_COMMUNITY): Payer: Medicare PPO

## 2018-06-19 DIAGNOSIS — I509 Heart failure, unspecified: Secondary | ICD-10-CM

## 2018-06-19 DIAGNOSIS — Z8249 Family history of ischemic heart disease and other diseases of the circulatory system: Secondary | ICD-10-CM | POA: Diagnosis not present

## 2018-06-19 DIAGNOSIS — Z90710 Acquired absence of both cervix and uterus: Secondary | ICD-10-CM

## 2018-06-19 DIAGNOSIS — G4733 Obstructive sleep apnea (adult) (pediatric): Secondary | ICD-10-CM | POA: Diagnosis present

## 2018-06-19 DIAGNOSIS — J9601 Acute respiratory failure with hypoxia: Secondary | ICD-10-CM | POA: Diagnosis present

## 2018-06-19 DIAGNOSIS — I5023 Acute on chronic systolic (congestive) heart failure: Secondary | ICD-10-CM | POA: Diagnosis not present

## 2018-06-19 DIAGNOSIS — G9341 Metabolic encephalopathy: Secondary | ICD-10-CM | POA: Diagnosis not present

## 2018-06-19 DIAGNOSIS — Z8669 Personal history of other diseases of the nervous system and sense organs: Secondary | ICD-10-CM | POA: Diagnosis not present

## 2018-06-19 DIAGNOSIS — I4892 Unspecified atrial flutter: Secondary | ICD-10-CM | POA: Diagnosis present

## 2018-06-19 DIAGNOSIS — G4700 Insomnia, unspecified: Secondary | ICD-10-CM | POA: Diagnosis present

## 2018-06-19 DIAGNOSIS — Z7902 Long term (current) use of antithrombotics/antiplatelets: Secondary | ICD-10-CM

## 2018-06-19 DIAGNOSIS — Z87891 Personal history of nicotine dependence: Secondary | ICD-10-CM

## 2018-06-19 DIAGNOSIS — I110 Hypertensive heart disease with heart failure: Secondary | ICD-10-CM | POA: Diagnosis present

## 2018-06-19 DIAGNOSIS — I5043 Acute on chronic combined systolic (congestive) and diastolic (congestive) heart failure: Secondary | ICD-10-CM | POA: Diagnosis present

## 2018-06-19 DIAGNOSIS — I10 Essential (primary) hypertension: Secondary | ICD-10-CM | POA: Diagnosis not present

## 2018-06-19 DIAGNOSIS — Z820 Family history of epilepsy and other diseases of the nervous system: Secondary | ICD-10-CM

## 2018-06-19 DIAGNOSIS — I447 Left bundle-branch block, unspecified: Secondary | ICD-10-CM | POA: Diagnosis present

## 2018-06-19 DIAGNOSIS — R21 Rash and other nonspecific skin eruption: Secondary | ICD-10-CM | POA: Diagnosis present

## 2018-06-19 DIAGNOSIS — F419 Anxiety disorder, unspecified: Secondary | ICD-10-CM | POA: Diagnosis present

## 2018-06-19 DIAGNOSIS — Z515 Encounter for palliative care: Secondary | ICD-10-CM | POA: Diagnosis not present

## 2018-06-19 DIAGNOSIS — G928 Other toxic encephalopathy: Secondary | ICD-10-CM

## 2018-06-19 DIAGNOSIS — I671 Cerebral aneurysm, nonruptured: Secondary | ICD-10-CM | POA: Diagnosis present

## 2018-06-19 DIAGNOSIS — E871 Hypo-osmolality and hyponatremia: Secondary | ICD-10-CM | POA: Diagnosis present

## 2018-06-19 DIAGNOSIS — K7460 Unspecified cirrhosis of liver: Secondary | ICD-10-CM

## 2018-06-19 DIAGNOSIS — R6510 Systemic inflammatory response syndrome (SIRS) of non-infectious origin without acute organ dysfunction: Secondary | ICD-10-CM | POA: Diagnosis not present

## 2018-06-19 DIAGNOSIS — G92 Toxic encephalopathy: Secondary | ICD-10-CM | POA: Diagnosis present

## 2018-06-19 DIAGNOSIS — R230 Cyanosis: Secondary | ICD-10-CM | POA: Diagnosis present

## 2018-06-19 DIAGNOSIS — Z7901 Long term (current) use of anticoagulants: Secondary | ICD-10-CM | POA: Diagnosis not present

## 2018-06-19 DIAGNOSIS — I480 Paroxysmal atrial fibrillation: Secondary | ICD-10-CM | POA: Diagnosis present

## 2018-06-19 DIAGNOSIS — I5082 Biventricular heart failure: Secondary | ICD-10-CM | POA: Diagnosis present

## 2018-06-19 DIAGNOSIS — E162 Hypoglycemia, unspecified: Secondary | ICD-10-CM | POA: Diagnosis present

## 2018-06-19 DIAGNOSIS — G40409 Other generalized epilepsy and epileptic syndromes, not intractable, without status epilepticus: Secondary | ICD-10-CM | POA: Diagnosis present

## 2018-06-19 DIAGNOSIS — G9340 Encephalopathy, unspecified: Secondary | ICD-10-CM | POA: Diagnosis not present

## 2018-06-19 DIAGNOSIS — Z66 Do not resuscitate: Secondary | ICD-10-CM | POA: Diagnosis present

## 2018-06-19 DIAGNOSIS — E161 Other hypoglycemia: Secondary | ICD-10-CM | POA: Diagnosis not present

## 2018-06-19 DIAGNOSIS — Z7952 Long term (current) use of systemic steroids: Secondary | ICD-10-CM

## 2018-06-19 DIAGNOSIS — A419 Sepsis, unspecified organism: Secondary | ICD-10-CM | POA: Diagnosis present

## 2018-06-19 DIAGNOSIS — Z79891 Long term (current) use of opiate analgesic: Secondary | ICD-10-CM

## 2018-06-19 DIAGNOSIS — I2510 Atherosclerotic heart disease of native coronary artery without angina pectoris: Secondary | ICD-10-CM | POA: Diagnosis present

## 2018-06-19 DIAGNOSIS — Z79899 Other long term (current) drug therapy: Secondary | ICD-10-CM

## 2018-06-19 DIAGNOSIS — R7989 Other specified abnormal findings of blood chemistry: Secondary | ICD-10-CM | POA: Diagnosis not present

## 2018-06-19 DIAGNOSIS — R6520 Severe sepsis without septic shock: Secondary | ICD-10-CM | POA: Diagnosis present

## 2018-06-19 MED ORDER — HEPARIN (PORCINE) IN NACL 100-0.45 UNIT/ML-% IJ SOLN: 750.0000 [IU]/h | INTRAMUSCULAR | Status: DC

## 2018-06-19 MED ORDER — HEPARIN BOLUS VIA INFUSION
3000.0000 [IU] | Freq: Once | INTRAVENOUS | Status: DC
  Filled 2018-06-19: qty 3000

## 2018-06-19 MED ORDER — AMIODARONE HCL IN DEXTROSE 360-4.14 MG/200ML-% IV SOLN
15.0000 mg/h | INTRAVENOUS | Status: DC
  Administered 2018-06-20: 15 mg/h via INTRAVENOUS
  Filled 2018-06-19 (×2): qty 200

## 2018-06-19 MED ORDER — VANCOMYCIN HCL IN DEXTROSE 1-5 GM/200ML-% IV SOLN
1000.0000 mg | Freq: Once | INTRAVENOUS | Status: DC
  Filled 2018-06-19: qty 200

## 2018-06-19 MED ORDER — DEXTROSE-NACL 5-0.9 % IV SOLN
INTRAVENOUS | Status: DC
  Administered 2018-06-20: via INTRAVENOUS

## 2018-06-19 MED ORDER — PIPERACILLIN-TAZOBACTAM 3.375 G IVPB 30 MIN
3.3750 g | Freq: Once | INTRAVENOUS | Status: AC
  Administered 2018-06-20: 3.375 g via INTRAVENOUS
  Filled 2018-06-19: qty 50

## 2018-06-19 NOTE — Progress Notes (Signed)
2215 Pt arrived via [facility] to room 2C05. Pt lethargic, groans to sternal rubbing only. O2 sats in low 50s. Probe on earlobe as bilateral fingers cyanotic, toes cyanotic. Skin checked and foam applied to coccyx for prevention. CHG bath given, VS taken. Son brought back to bedside and updated with POC. [facility] paged, awaiting new orders.   2230 Dr. Mahala Menghini at bedside.

## 2018-06-19 NOTE — Progress Notes (Signed)
RT went to pt room to perform ABG as ordered Pt has a negative Allen's test. Md states not to do that the nursing staff can get a VBG. RT will continue to monitor.

## 2018-06-19 NOTE — H&P (Signed)
HPI  [name] Rosales WUJ:[id number] DOB: 17-Dec-1940 DOA: 2018/07/08  PCP: System, Pcp Not In   Chief Complaint: toxic metabolic encephalopathy  HPI:  77 year old female HTN Recent diagnosed systolic heart failure care of Dr. Dimitri Ped admission from heart failure clinic 03/24/2018---8/3 worsening functional status marked volume overload-RL HC showed 90% RCA stenosis Rx PCI plus DES Postop A. 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 Insomnia  Patient was admitted to [facility] in [place] on 06/16/2018-she was found to have altered mental status after cardiac rehab found to have O2 sat in the 30s and was cyanotic glucose was also be found to be less than 50 and she had difficulty giving any history On admission BUN and creatinine at 41 and 1.19 sodium is 120 lactic acid 4.7 UA was negative glucose initially was 28 blood cultures were pending She was admitted with a working diagnosis of acute hypoxic respiratory failure started on Levaquin and cefepime vancomycin and was found to be at high risk for deterioration decompensation-apparently was on pressors on admission CT scan on admission showed edema and there was no evidence suggestive of PE it was felt like she had possible volume overload on the CT scan And diuretics were held she was kept on D5 normal saline  Her troponins bumped from 0.018-0.038 and family requested transfer to [facility] secondary to slow progress at outside hospital  Looking through the notes it appears she was given multiple rounds of Ativan on 10/21 and was also given morphine the morning of the transfer- Also was given a trial of speech therapy but unfortunately failed swallow eval   Unable to obtain review of  systems-opens eyes history primarily taken from the son  He tells me that 2 to 3 weeks ago she was doing fair and she has had some problems sleeping-she has not been over taking any sedating medications or surreptitiously taking medications-she has not been on her BiPAP over the past couple days since being hospitalized as O2 sat has been good-   Past Medical History:  Diagnosis Date  . Choreiform movements   . Fluid overload   . Headache    migraines  . Heart failure (HCC)   . Hypertension   . Lower extremity pain   . Sleep apnea    wears cpap  . Wears dentures     Past Surgical History:  Procedure Laterality Date  . ABDOMINAL HYSTERECTOMY    . ARTERY REPAIR Right 04/26/2018   Procedure: REPAIR RIGHT RADIAL ARTERY PSEUDOANEURYSM;  Surgeon: Nada Libman, MD;  Location: [facility] OR;  Service: Vascular;  Laterality: Right;  . CATARACT EXTRACTION W/ INTRAOCULAR LENS  IMPLANT, BILATERAL    . COLONOSCOPY    . CORONARY STENT INTERVENTION N/A 03/27/2018   Procedure: CORONARY STENT INTERVENTION;  Surgeon: Lyn Records, MD;  Location[facility] INVASIVE CV LAB;  Service: Cardiovascular;  Laterality: N/A;  . RIGHT/LEFT HEART CATH AND CORONARY ANGIOGRAPHY N/A 03/27/2018   Procedure: RIGHT/LEFT HEART CATH AND CORONARY ANGIOGRAPHY;  Surgeon: Dolores Patty, MD;  Location: [facility] INVASIVE CV LAB;  Service: Cardiovascular;  Laterality: N/A;     reports that she has quit smoking. Her smoking use included cigarettes. She has never used smokeless tobacco. She reports that she drank alcohol. She reports that she does not use drugs. Mobility: Was doing  cardiac rehab with a walker  No Known Allergies  Family History  Problem Relation Age of Onset  . Alzheimer's disease Mother   . Other Father        MVA  . Hypertension Brother      Prior to Admission medications   Medication Sig Start Date End Date Taking? Authorizing Provider  acetaminophen (TYLENOL) 500 MG tablet Take 1,000 mg by mouth every 6  (six) hours as needed for moderate pain or headache.     [provider]  amiodarone (PACERONE) 200 MG tablet Take 1 tablet (200 mg total) by mouth 2 (two) times daily. On 04/08/2018, reduce to 1 tablet (200mg ) once daily. 04/01/18   Strader, Lennart Pall, PA-C  apixaban (ELIQUIS) 5 MG TABS tablet Take 1 tablet (5 mg total) by mouth 2 (two) times daily. 04/27/18   Lars Mage, PA-C  atorvastatin (LIPITOR) 80 MG tablet Take 1 tablet (80 mg total) by mouth daily at 6 PM. 04/01/18   Strader, [place] M, PA-C  calcium carbonate (TUMS - DOSED IN MG ELEMENTAL CALCIUM) 500 MG chewable tablet Chew 1 tablet by mouth daily as needed for indigestion or heartburn.     [provider]  carvedilol (COREG) 3.125 MG tablet Take 1 tablet (3.125 mg total) by mouth 2 (two) times daily. 04/20/18 07/19/18  Bensimhon, Bevelyn Buckles, MD  cephALEXin (KEFLEX) 500 MG capsule Take 1 capsule (500 mg total) by mouth 3 (three) times daily. 05/24/18   Sherren Kerns, MD  clopidogrel (PLAVIX) 75 MG tablet Take 1 tablet (75 mg total) by mouth daily with breakfast. 04/02/18   [place] Ouch, Lennart Pall, PA-C  hydrALAZINE (APRESOLINE) 25 MG tablet Take 1 tablet (25 mg total) by mouth every 8 (eight) hours. 04/07/18   Bensimhon, Bevelyn Buckles, MD  multivitamin-lutein [facility]) CAPS capsule Take 1 capsule by mouth daily.    [provider]  nitroGLYCERIN (NITROSTAT) 0.4 MG SL tablet Place 1 tablet (0.4 mg total) under the tongue every 5 (five) minutes as needed for chest pain. 04/01/18   Strader, Lennart Pall, PA-C  phenytoin (DILANTIN) 100 MG ER capsule Take 1-2 capsules (100-200 mg total) by mouth See admin instructions. Take 200 mg every morning  Take 100 mg midday (12 pm) Take 100 mg at bedtime 04/01/18   Strader, [place] M, PA-C  predniSONE (DELTASONE) 10 MG tablet Take 10 mg by mouth daily with breakfast.    [provider]  sacubitril-valsartan (ENTRESTO) 49-51 MG Take 1 tablet by mouth 2 (two) times daily. 06/01/18    Bensimhon, Bevelyn Buckles, MD  spironolactone (ALDACTONE) 25 MG tablet Take 1 tablet (25 mg total) by mouth daily. 04/01/18   [place] Ouch, Lennart Pall, PA-C  temazepam (RESTORIL) 15 MG capsule May take 1- 2 caps at bedtime as needed for sleep 05/29/18   Bensimhon, Bevelyn Buckles, MD  torsemide (DEMADEX) 20 MG tablet Take 2 tablets (40 mg total) by mouth daily. 05/29/18   Bensimhon, Bevelyn Buckles, MD  traMADol (ULTRAM) 50 MG tablet Take 1 tablet (50 mg total) by mouth 3 (three) times daily as needed. 04/07/18   Bensimhon, Bevelyn Buckles, MD  venlafaxine XR (EFFEXOR XR) 37.5 MG 24 hr capsule Take 1 capsule (37.5 mg total) by mouth daily. 05/09/18   Drema Dallas, DO    Physical Exam:  There were no vitals filed for this visit.    lethargic GCS is ~8  Mild crackles bilaterally and posterior laterally  S1-S2 slightly tachycardic sinus rhythm, PMI slightly displaced abdomen is  soft nontender little bit distended but no real well  No lower extremity edema however there appears to be increased pain in the left lower extremity  Skin was not completely evaluated on the sacrum  Neurologically she does not answer commands as above per GCS I am unable to specifically get her to cooperate with full neuro exam pupils are slightly dilated about 4 mm  I have personally reviewed following labs and imaging studies  Labs:   None are available at this time  Imaging studies:   None  Medical tests:   EKG independently reviewed: Marland Kitchen    Test discussed with performing physician:  No  Decision to obtain old records:   No  Review and summation of old records:   No  Principal Problem:   Toxic metabolic encephalopathy Active Problems:   Acute on chronic systolic heart failure (HCC)   Coronary artery disease involving native coronary artery of native heart without angina pectoris   PAF (paroxysmal atrial fibrillation) (HCC)   Severe hypertension   Sepsis (HCC)   Assessment/Plan Toxic metabolic encephalopathy-would scan  head as may be having some neurological event although it is unlikely as she does move both arms although does not cooperate properly--we will also get ammonia level B12 level and TSH-would hold all anxiolytics and other meds at this time--- would hold Restoril 15 as well as tramadol at this time and use if needed IV Tylenol for pain would also hold Effexor Fever 102 centrally-currently being treated for sepsis will reculture apparently cultures from outside hospital were negative getting x-ray of the chest-we will need Foley catheter will need UA collected and would start broad-spectrum Vanco and Zosyn-cycle lactic acid procalcitonin and get BNP to help determine and differentiate between heart failure and pneumonic process Troponin bump as above-cycle troponins, not at this juncture complaining of chest pain Paroxysmal A. fib-placed on amiodarone GTT at 15 mg an hour-she is not safe to take any meds so will hold statin Coreg hydralazine nitroglycerin Entresto and torsemide for now--would place on IV heparin at this time Severe hypoglycemia-unable to eat and not alert enough to will need to be on D5 normal saline Prior history of systolic heart failure status post stent-need to hold Plavix for now and may need consideration for rectal administration of aspirin as has a recent stent I do not think she is stable to take meds   Patient will need stepdown level stay have had a long discussion with her son at the bedside and have explained the serious nature of her illness-he is well aware risk for compromise-we will update further-cardiology to see at some point later today although from my perspective I think some of her concerns are related to subacute aspiration with a pneumonitis and she may be septic secondary to the same    Severity of Illness: The appropriate patient status for this patient is INPATIENT. Inpatient status is judged to be reasonable and necessary in order to provide the required  intensity of service to ensure the patient's safety. The patient's presenting symptoms, physical exam findings, and initial radiographic and laboratory data in the context of their chronic comorbidities is felt to place them at high risk for further clinical deterioration. Furthermore, it is not anticipated that the patient will be medically stable for discharge from the hospital within 2 midnights of admission. The following factors support the patient status of inpatient.   " The patient's presenting symptoms include sepsis. " The worrisome physical exam findings include metabolic encephalopathy. "  The initial radiographic and laboratory data are worrisome. " The chronic co-morbidities include multiple.   * I certify that at the point of admission it is my clinical judgment that the patient will require inpatient hospital care spanning beyond 2 midnights from the point of admission due to high intensity of service, high risk for further deterioration and high frequency of surveillance required.*     DVT prophylaxis: IV heparin Code Status: DNR Family Communication: Discussed with son Consults called: Cardiology to see  Time spent: 75 minutes minutes  Mahala Menghini, MD  [facility] Direct contact: [phone] --Via amion app OR  --[email or web address]; password [email or web address]  7PM-7AM contact night coverage as above  2018/07/11, 10:24 PM

## 2018-06-20 ENCOUNTER — Other Ambulatory Visit: Payer: Self-pay

## 2018-06-20 ENCOUNTER — Encounter (HOSPITAL_COMMUNITY): Payer: Self-pay

## 2018-06-20 DIAGNOSIS — G9341 Metabolic encephalopathy: Secondary | ICD-10-CM

## 2018-06-20 LAB — BLOOD GAS, VENOUS
ACID-BASE DEFICIT: 2.5 mmol/L — AB (ref 0.0–2.0)
BICARBONATE: 22.7 mmol/L (ref 20.0–28.0)
FIO2: 100
O2 Saturation: 64.6 %
PATIENT TEMPERATURE: 98.6
PO2 VEN: 39.3 mmHg (ref 32.0–45.0)
pCO2, Ven: 45.3 mmHg (ref 44.0–60.0)
pH, Ven: 7.32 (ref 7.250–7.430)

## 2018-06-20 LAB — CBC
HCT: 32 % — ABNORMAL LOW (ref 36.0–46.0)
HEMOGLOBIN: 10.1 g/dL — AB (ref 12.0–15.0)
MCH: 31 pg (ref 26.0–34.0)
MCHC: 31.6 g/dL (ref 30.0–36.0)
MCV: 98.2 fL (ref 80.0–100.0)
Platelets: 148 10*3/uL — ABNORMAL LOW (ref 150–400)
RBC: 3.26 MIL/uL — ABNORMAL LOW (ref 3.87–5.11)
RDW: 14.9 % (ref 11.5–15.5)
WBC: 4.7 10*3/uL (ref 4.0–10.5)
nRBC: 0 % (ref 0.0–0.2)

## 2018-06-20 LAB — COMPREHENSIVE METABOLIC PANEL
ALT: 67 U/L — ABNORMAL HIGH (ref 0–44)
ALT: 73 U/L — AB (ref 0–44)
AST: 108 U/L — AB (ref 15–41)
AST: 114 U/L — ABNORMAL HIGH (ref 15–41)
Albumin: 2.7 g/dL — ABNORMAL LOW (ref 3.5–5.0)
Albumin: 2.9 g/dL — ABNORMAL LOW (ref 3.5–5.0)
Alkaline Phosphatase: 124 U/L (ref 38–126)
Alkaline Phosphatase: 128 U/L — ABNORMAL HIGH (ref 38–126)
Anion gap: 7 (ref 5–15)
Anion gap: 8 (ref 5–15)
BUN: 15 mg/dL (ref 8–23)
BUN: 17 mg/dL (ref 8–23)
CALCIUM: 7.7 mg/dL — AB (ref 8.9–10.3)
CHLORIDE: 101 mmol/L (ref 98–111)
CO2: 22 mmol/L (ref 22–32)
CO2: 21 mmol/L — AB (ref 22–32)
CREATININE: 1.04 mg/dL — AB (ref 0.44–1.00)
Calcium: 7.6 mg/dL — ABNORMAL LOW (ref 8.9–10.3)
Chloride: 103 mmol/L (ref 98–111)
Creatinine, Ser: 1.01 mg/dL — ABNORMAL HIGH (ref 0.44–1.00)
GFR calc Af Amer: >60 mL/min (ref >60)
GFR calc non Af Amer: 52 mL/min — ABNORMAL LOW (ref >60)
GFR, EST AFRICAN AMERICAN: 59 mL/min — AB (ref >60)
GFR, EST NON AFRICAN AMERICAN: 50 mL/min — AB (ref >60)
GLUCOSE: 112 mg/dL — AB (ref 70–99)
Glucose, Bld: 201 mg/dL — ABNORMAL HIGH (ref 70–99)
POTASSIUM: 3.7 mmol/L (ref 3.5–5.1)
Potassium: 3.9 mmol/L (ref 3.5–5.1)
SODIUM: 130 mmol/L — AB (ref 135–145)
Sodium: 132 mmol/L — ABNORMAL LOW (ref 135–145)
Total Bilirubin: 1 mg/dL (ref 0.3–1.2)
Total Bilirubin: 1 mg/dL (ref 0.3–1.2)
Total Protein: 6.3 g/dL — ABNORMAL LOW (ref 6.5–8.1)
Total Protein: 6.5 g/dL (ref 6.5–8.1)

## 2018-06-20 LAB — GLUCOSE, CAPILLARY
GLUCOSE-CAPILLARY: 47 mg/dL — AB (ref 70–99)
GLUCOSE-CAPILLARY: 63 mg/dL — AB (ref 70–99)
GLUCOSE-CAPILLARY: 64 mg/dL — AB (ref 70–99)
GLUCOSE-CAPILLARY: 132 mg/dL — AB (ref 70–99)
GLUCOSE-CAPILLARY: 43 mg/dL — AB (ref 70–99)
GLUCOSE-CAPILLARY: 70 mg/dL (ref 70–99)
Glucose-Capillary: 70 mg/dL (ref 70–99)
Glucose-Capillary: 99 mg/dL (ref 70–99)
Glucose-Capillary: 81 mg/dL (ref 70–99)
Glucose-Capillary: 71 mg/dL (ref 70–99)
Glucose-Capillary: 32 mg/dL — CL (ref 70–99)
Glucose-Capillary: 30 mg/dL — CL (ref 70–99)
Glucose-Capillary: 41 mg/dL — CL (ref 70–99)

## 2018-06-20 LAB — HEPARIN LEVEL (UNFRACTIONATED): Heparin Unfractionated: 1.82 IU/mL — ABNORMAL HIGH (ref 0.30–0.70)

## 2018-06-20 LAB — AMMONIA: AMMONIA: 49 umol/L — AB (ref 9–35)

## 2018-06-20 LAB — PROCALCITONIN
Procalcitonin: 0.21 ng/mL
Procalcitonin: 0.28 ng/mL

## 2018-06-20 LAB — PROTIME-INR
INR: 1.94
Prothrombin Time: 21.9 seconds — ABNORMAL HIGH (ref 11.4–15.2)

## 2018-06-20 LAB — CBC WITH DIFFERENTIAL/PLATELET
Abs Immature Granulocytes: 0.01 10*3/uL (ref 0.00–0.07)
BASOS ABS: 0 10*3/uL (ref 0.0–0.1)
Basophils Relative: 0 %
EOS ABS: 0 10*3/uL (ref 0.0–0.5)
EOS PCT: 0 %
HCT: 32.6 % — ABNORMAL LOW (ref 36.0–46.0)
Hemoglobin: 10.3 g/dL — ABNORMAL LOW (ref 12.0–15.0)
IMMATURE GRANULOCYTES: 0 %
Lymphocytes Relative: 12 %
Lymphs Abs: 0.5 10*3/uL — ABNORMAL LOW (ref 0.7–4.0)
MCH: 31.5 pg (ref 26.0–34.0)
MCHC: 31.6 g/dL (ref 30.0–36.0)
MCV: 99.7 fL (ref 80.0–100.0)
Monocytes Absolute: 0.4 10*3/uL (ref 0.1–1.0)
Monocytes Relative: 11 %
NRBC: 0 % (ref 0.0–0.2)
Neutro Abs: 3.1 10*3/uL (ref 1.7–7.7)
Neutrophils Relative %: 77 %
PLATELETS: 176 10*3/uL (ref 150–400)
RBC: 3.27 MIL/uL — AB (ref 3.87–5.11)
RDW: 14.8 % (ref 11.5–15.5)
WBC: 4 10*3/uL (ref 4.0–10.5)

## 2018-06-20 LAB — VITAMIN B12: VITAMIN B 12: 2103 pg/mL — AB (ref 180–914)

## 2018-06-20 LAB — APTT: aPTT: 122 seconds — ABNORMAL HIGH (ref 24–36)

## 2018-06-20 LAB — TSH: TSH: 2.446 u[IU]/mL (ref 0.350–4.500)

## 2018-06-20 MED ORDER — MORPHINE SULFATE (PF) 2 MG/ML IV SOLN
INTRAVENOUS | Status: AC
  Administered 2018-06-20: 1 mg via INTRAVENOUS
  Filled 2018-06-20: qty 1

## 2018-06-20 MED ORDER — FUROSEMIDE 10 MG/ML IJ SOLN
40.0000 mg | Freq: Once | INTRAMUSCULAR | Status: AC
  Administered 2018-06-20: 40 mg via INTRAVENOUS
  Filled 2018-06-20: qty 4

## 2018-06-20 MED ORDER — DEXTROSE 10 % IV SOLN
INTRAVENOUS | Status: DC
  Administered 2018-06-20: 17:00:00 via INTRAVENOUS
  Filled 2018-06-20: qty 1000

## 2018-06-20 MED ORDER — DEXTROSE 50 % IV SOLN
1.0000 | Freq: Once | INTRAVENOUS | Status: AC
  Administered 2018-06-20 (×3): 50 mL via INTRAVENOUS

## 2018-06-20 MED ORDER — OXYCODONE HCL 5 MG PO TABS
5.0000 mg | ORAL_TABLET | ORAL | Status: DC | PRN
  Administered 2018-06-20: 5 mg via ORAL
  Filled 2018-06-20: qty 1

## 2018-06-20 MED ORDER — MORPHINE SULFATE (PF) 2 MG/ML IV SOLN
1.0000 mg | INTRAVENOUS | Status: DC | PRN
  Administered 2018-06-20: 1 mg via INTRAVENOUS
  Filled 2018-06-20: qty 1

## 2018-06-20 MED ORDER — MORPHINE 100MG IN NS 100ML (1MG/ML) PREMIX INFUSION
1.0000 mg/h | INTRAVENOUS | Status: DC
  Administered 2018-06-20: 1 mg/h via INTRAVENOUS
  Administered 2018-06-21: 10 mg/h via INTRAVENOUS
  Filled 2018-06-20 (×3): qty 100

## 2018-06-20 MED ORDER — DEXTROSE 50 % IV SOLN
INTRAVENOUS | Status: AC
  Administered 2018-06-20: 50 mL
  Filled 2018-06-20: qty 100

## 2018-06-20 MED ORDER — PIPERACILLIN-TAZOBACTAM 3.375 G IVPB
3.3750 g | Freq: Three times a day (TID) | INTRAVENOUS | Status: DC
  Administered 2018-06-20 – 2018-06-21 (×4): 3.375 g via INTRAVENOUS
  Filled 2018-06-20 (×5): qty 50

## 2018-06-20 MED ORDER — DEXTROSE 50 % IV SOLN
INTRAVENOUS | Status: AC
  Administered 2018-06-20: 50 mL via INTRAVENOUS
  Filled 2018-06-20: qty 50

## 2018-06-20 MED ORDER — VANCOMYCIN HCL IN DEXTROSE 1-5 GM/200ML-% IV SOLN
1000.0000 mg | INTRAVENOUS | Status: DC
  Administered 2018-06-21: 1000 mg via INTRAVENOUS
  Filled 2018-06-20: qty 200

## 2018-06-20 MED ORDER — TRAMADOL HCL 50 MG PO TABS
50.0000 mg | ORAL_TABLET | Freq: Four times a day (QID) | ORAL | Status: DC | PRN
  Administered 2018-06-20: 50 mg via ORAL
  Filled 2018-06-20: qty 1

## 2018-06-20 MED ORDER — DEXTROSE 50 % IV SOLN
INTRAVENOUS | Status: AC
  Administered 2018-06-20: 50 mL via INTRAVENOUS
  Filled 2018-06-20: qty 100

## 2018-06-20 MED ORDER — HEPARIN (PORCINE) IN NACL 100-0.45 UNIT/ML-% IJ SOLN
700.0000 [IU]/h | INTRAMUSCULAR | Status: DC
  Administered 2018-06-20: 900 [IU]/h via INTRAVENOUS
  Filled 2018-06-20: qty 250

## 2018-06-20 MED ORDER — MORPHINE SULFATE (PF) 2 MG/ML IV SOLN: 2.0000 mg | INTRAVENOUS | Status: DC | PRN

## 2018-06-20 MED ORDER — MORPHINE SULFATE (PF) 2 MG/ML IV SOLN: 2.0000 mg | Freq: Once | INTRAVENOUS | Status: DC

## 2018-06-20 MED ORDER — VANCOMYCIN HCL IN DEXTROSE 1-5 GM/200ML-% IV SOLN
1000.0000 mg | Freq: Once | INTRAVENOUS | Status: AC
  Administered 2018-06-20: 1000 mg via INTRAVENOUS
  Filled 2018-06-20: qty 200

## 2018-06-20 NOTE — Progress Notes (Addendum)
ANTICOAGULATION and ANTIBIOTIC CONSULT NOTE - Initial Consult  Pharmacy Consult for heparin and Vancocin/Zosyn Indication: atrial fibrillation and aspiration pneumonia/bacteremia  No Known Allergies  Patient Measurements: Height: 5\' 6"  (167.6 cm) Weight: 134 lb (60.8 kg) IBW/kg (Calculated) : 59.3  Vital Signs: Temp: 102.6 F (39.2 C) (10/21 2300) Temp Source: Rectal (10/21 2300) BP: 144/98 (10/21 2214) Pulse Rate: 106 (10/21 2214)  Labs: Recent Labs    06/13/2018 2335  HGB 10.3*  HCT 32.6*  PLT 176  CREATININE 1.04*    Estimated Creatinine Clearance: 42.4 mL/min (A) (by C-G formula based on SCr of 1.04 mg/dL (H)).   Medical History: Past Medical History:  Diagnosis Date  . Choreiform movements   . Fluid overload   . Headache    migraines  . Heart failure (HCC)   . Hypertension   . Lower extremity pain   . Sleep apnea    wears cpap  . Wears dentures     Assessment: 77yo female transferred from [facility] for toxic metabolic encephalopathy, unable to take oral medications, to transition from Eliquis for Afib to UFH (last dose Eliquis appears to be 10/21 at 0930), and to continue IV ABX for aspiration pneumonia and bacteremia (was on vanc 1g Q24, last dose 10/20 2200, and cefepime 1g Q8 last dose 10/21 1000, at OSH).  Goal of Therapy:  Heparin level 0.3-0.7 units/ml aPTT 66-102 seconds  Vanc trough 15-20 mg/L Monitor platelets by anticoagulation protocol: Yes   Plan:  Will begin heparin gtt at 900 units/hr and monitor heparin levels, aPTT (while Eliquis affects anti-Xa), and CBC. Will continue vancomycin 1000mg  IV Q24H and start Zosyn 3.375g IV Q8H and monitor CBC, Cx, levels prn.  Vernard Gambles, PharmD, BCPS  06/20/2018,12:41 AM

## 2018-06-20 NOTE — Progress Notes (Signed)
[facility] TEAM 1 - Stepdown/ICU TEAM  [name] Rosales  OEV:[id number] DOB: [birth year] DOA: 06/28/2018 PCP: System, Pcp Not In    Brief Narrative:  77 y.o. female w/ a hx of HTN, CAD s/p RCA PCI 04/27/92, systolic CHF, LBBB, Afib/Flutter, myclonic seizure, radial artery PSA s/p repair 04/26/18, and OSA who was admitted to [facility] in [place], [place] 06/16/18 for AMS after she presented to cardiac rehab cyanotic and hypoglycemic. On admission there, Cr 1.19, BUN 41, Na 120, and lactate 4.7. UA negative. Glucose 28 initially. Admitted for acute hypoxic respiratory failure and started on Vanc/Cefepime. CT scan showed pulmonary edema but no PE. Troponins increased from 0.018 -> 0.038 and family requested transfer to [facility].   Significant Events: 10/18 admit to [facility], [place], [place] 10/21 Transfer to [facility]   Subjective: Pt has been lethargic th/o most of the day. When awake she is c/o generalized severe achy pain. She has had intermittent episodes of severe recurrent hypoglycemia later in the day, w/ CBG as low as 30 and clear sx associated w/ this to include obtundation and hypoventilation w/ hypoxia. She was able to deny cp, sob, or N/V during my earlier exam.   Assessment & Plan:  Severe recurrent refractory hypoglycemia  Initiate workup w/ extensive laboratory testing - pt is not diabetic and is not on DM meds - nobody else lives in her home so there should be no opportunity for confusion w/ someone else's meds - cant rule out pharmacy error so sulfonylurea panel sent - D5 not sufficient therefore D10 started -   SIRS v/s Sepsis of unclear etiology  CXR suggestive of possible LLL PNA - on Vanc and Zosyn empirically - clinically my suspicion for an active infection is low - I suspect her profound idiopathic hypoglycemia may be to blame for her presentation   Mildly elevated ammonia Significance not clear - US liver pending - ?passive congestion related to R heart failure - check hepatitis panel to be  complete   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  Care per CHF Team   Acute encephalopathy Due to combination of refractory hypoglycemia and ativan dosing at OSH - avoid benzos - minimize other sedatives as able   Hyponatremia  Due to CHF - not severe   HTN BP controlled   CAD Care per Cards  LBBB  Parox Afib  Care per Cards   OSA  ?Cutaneous Lupus v/s Sjogrens - ?Reynauds  Pt has acrocyanosis of fingers and toes - this is interferring w/ adequate O2 sat monitoring - check ESR   Myoclonic tremors  DDx includes phenytoin-induced chorea and hyponatremia - has been undergoing outpt w/u   ICA aneurysm   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 rate 18, height _0  (1.676 m), weight 60.8 kg, SpO2 100 %.  Intake/Output Summary (Last 24 hours) at 06/20/2018 1736 Last data filed at 06/20/2018 1510 Gross per 24 hour  Intake 1367.64 ml  Output 500 ml  Net 867.64 ml  Filed Weights   06/20/18 0002  Weight: 60.8 kg    Examination: General: No acute respiratory distress - somnolent  Lungs: fine crackles B bases - no wheezing  Cardiovascular: Regular rate and rhythm  Abdomen: Nontender, nondistended, soft, bowel sounds positive, no rebound, no appreciable mass Extremities: No signif clubbing, or edema bilateral lower extremities - acrocyanosis B hands and toes   CBC: Recent Labs  Lab 06/02/2018 2335 06/20/18 0304  WBC 4.0 4.7  NEUTROABS 3.1  --   HGB 10.3* 10.1*  HCT 32.6* 32.0*  MCV 99.7 98.2  PLT 176 148*    Basic Metabolic Panel: Recent Labs  Lab 06/29/2018 2335 06/20/18 0304  NA 130* 132*  K 3.9 3.7  CL 101 103  CO2 21* 22  GLUCOSE 201* 112*  BUN 17 15  CREATININE 1.04* 1.01*  CALCIUM 7.7* 7.6*   GFR: Estimated Creatinine Clearance: 43.7 mL/min (A) (by C-G formula based on SCr of 1.01 mg/dL (H)).  Liver Function Tests: Recent Labs  Lab 06/05/2018 2335 06/20/18 0304  AST 114* 108*  ALT 73* 67*  ALKPHOS 128* 124  BILITOT 1.0 1.0  PROT 6.5 6.3*  ALBUMIN 2.9* 2.7*    Recent Labs  Lab 06/24/2018 2335  AMMONIA 49*    Coagulation Profile: Recent Labs  Lab 06/20/18 0304  INR 1.94    CBG: Recent Labs  Lab 06/20/18 0439 06/20/18 0806 06/20/18 1154 06/20/18 1649 06/20/18 1705  GLUCAP 81 71 132* 32* 70    Scheduled Meds: Continuous Infusions: . amiodarone 15 mg/hr (06/20/18 1500)  . dextrose 40 mL/hr at 06/20/18 1717  . heparin 700 Units/hr (06/20/18 1250)  . piperacillin-tazobactam (ZOSYN)  IV 3.375 g (06/20/18 1510)  . [START ON July 01, 2018] vancomycin       LOS: 1 day   Cherene Altes, MD [facility] Office  [phone] Pager - Text Page per Amion  If 7PM-7AM, please contact night-coverage per Amion 06/20/2018, 5:36 PM

## 2018-06-20 NOTE — Consult Note (Addendum)
Advanced Heart Failure Team Consult Note   Primary Physician: System, Pcp Not In PCP-Cardiologist:  Arvilla Meres, MD  Reason for Consultation: acute hypoxic resp failure in CHF  HPI:    [name] Rosales is seen today for evaluation of acute hypoxic resp failure in CHF at the request of Dr. Sharon Seller.   [name] Rosales is a 77 y.o. female  with h/o HTN, CAD s/p RCA PCI 03/27/18, systolic CHF, LBBB, h/o Afib/Flutter, myclonic seizure, h/o radial artery PSA s/p repair 04/26/18, and OSA.   Last seen in HF clinic 05/29/18 for acute visit. Felt poorly with poor sleep x 2 weeks. No PND as she had been wearing biPap. Weight up 7 lbs. EKG showed Aflutter. Instructed to take extra torsemide. Started on temazepam for sleep. Offered admission but she refused.  Pt was admitted to [facility] in [place], [place] 06/16/18 for AMS after pt presented to cardiac rehab cyanotic and hypoglycemic. On admission there, Cr 1.19, BUN 41, Na 120, and lactate 4.7. UA negative. Glucose 28 initially. Admitted for acute hypoxic respiratory failure and started on Vanc/Cefepime. CT scan showed pulmonary edema but no PE. Troponins increased from 0.018 -> 0.038 and family requested transfer to [facility].   Pertinent labs on admission here include Cr 1.04, BUN 17, K 3.9, WBC 4.0, Hgb 10.3, PCT 0.21 -> 0.28. ABG Ph 7.32, PCO2 45, PO2 39.3, Bicarb 22.7, Acid/Base 2.5.   Tmax 102.6. WBC 4.7 this am. On vanc/zosyn now.   Awake this am. Alert to person. Takes several moments to answer a question, but then answers clearly. States she is "hurting everywhere" and very uncomfortable. Got pain medicine about 15 minutes ago. Denies SOB. Son states she had  Labored breathing overnight but appears to have evened out this am.   CXR 06/06/2018  - Small pleural effusions L > R - Dense left lung base consolidation  BCx apparently drawn at [facility]. Last result reported as NGTD.   Echo 03/25/18 LVEF 30-35%, Grade 2 DD, mild/Mod MR, Severe LAE, RV mod  dilated, RV function low normal, Severe RAE, Mod TR, PA Peak pressure 55  Review of Systems: [y] = yes, [ ]  = no   General: Weight gain [ ] ; Weight loss [ ] ; Anorexia [ ] ; Fatigue [y]; Fever [y]; Chills [y]; Weakness [y]  Cardiac: Chest pain/pressure [ ] ; Resting SOB [ ] ; Exertional SOB [y]; Orthopnea [y]; Pedal Edema [ ] ; Palpitations [ ] ; Syncope [ ] ; Presyncope [ ] ; Paroxysmal nocturnal dyspnea[ ]   Pulmonary: Cough [y]; Wheezing[ ] ; Hemoptysis[ ] ; Sputum [ ] ; Snoring [ ]   GI: Vomiting[ ] ; Dysphagia[ ] ; Melena[ ] ; Hematochezia [ ] ; Heartburn[ ] ; Abdominal pain [ ] ; Constipation [ ] ; Diarrhea [ ] ; BRBPR [ ]   GU: Hematuria[ ] ; Dysuria [ ] ; Nocturia[ ]   Vascular: Pain in legs with walking [ ] ; Pain in feet with lying flat [ ] ; Non-healing sores [ ] ; Stroke [ ] ; TIA [ ] ; Slurred speech [ ] ;  Neuro: Headaches[y]; Vertigo[ ] ; Seizures[ ] ; Paresthesias[ ] ;Blurred vision [ ] ; Diplopia [ ] ; Vision changes [ ]   Ortho/Skin: Arthritis [y]; Joint pain [y]; Muscle pain [ ] ; Joint swelling [ ] ; Back Pain [ ] ; Rash [ ]   Psych: Depression[ ] ; Anxiety[ ]   Heme: Bleeding problems [ ] ; Clotting disorders [ ] ; Anemia [ ]   Endocrine: Diabetes [ ] ; Thyroid dysfunction[ ]   Home Medications Prior to Admission medications   Medication Sig Start Date End Date Taking? Authorizing Provider  acetaminophen (TYLENOL) 500 MG tablet  Take 1,000 mg by mouth every 6 (six) hours as needed for moderate pain or headache.     [provider]  amiodarone (PACERONE) 200 MG tablet Take 1 tablet (200 mg total) by mouth 2 (two) times daily. On 04/08/2018, reduce to 1 tablet (200mg ) once daily. 04/01/18   Strader, Lennart Pall, PA-C  apixaban (ELIQUIS) 5 MG TABS tablet Take 1 tablet (5 mg total) by mouth 2 (two) times daily. 04/27/18   Lars Mage, PA-C  atorvastatin (LIPITOR) 80 MG tablet Take 1 tablet (80 mg total) by mouth daily at 6 PM. 04/01/18   Strader, [place] M, PA-C  calcium carbonate (TUMS - DOSED IN MG ELEMENTAL  CALCIUM) 500 MG chewable tablet Chew 1 tablet by mouth daily as needed for indigestion or heartburn.     [provider]  carvedilol (COREG) 3.125 MG tablet Take 1 tablet (3.125 mg total) by mouth 2 (two) times daily. 04/20/18 07/19/18  Ellia Knowlton, Bevelyn Buckles, MD  cephALEXin (KEFLEX) 500 MG capsule Take 1 capsule (500 mg total) by mouth 3 (three) times daily. 05/24/18   Sherren Kerns, MD  clopidogrel (PLAVIX) 75 MG tablet Take 1 tablet (75 mg total) by mouth daily with breakfast. 04/02/18   Iran Ouch, Lennart Pall, PA-C  hydrALAZINE (APRESOLINE) 25 MG tablet Take 1 tablet (25 mg total) by mouth every 8 (eight) hours. 04/07/18   Abran Gavigan, Bevelyn Buckles, MD  multivitamin-lutein [facility]) CAPS capsule Take 1 capsule by mouth daily.    [provider]  nitroGLYCERIN (NITROSTAT) 0.4 MG SL tablet Place 1 tablet (0.4 mg total) under the tongue every 5 (five) minutes as needed for chest pain. 04/01/18   Strader, Lennart Pall, PA-C  phenytoin (DILANTIN) 100 MG ER capsule Take 1-2 capsules (100-200 mg total) by mouth See admin instructions. Take 200 mg every morning  Take 100 mg midday (12 pm) Take 100 mg at bedtime 04/01/18   Strader, [place] M, PA-C  predniSONE (DELTASONE) 10 MG tablet Take 10 mg by mouth daily with breakfast.    [provider]  sacubitril-valsartan (ENTRESTO) 49-51 MG Take 1 tablet by mouth 2 (two) times daily. 06/01/18   Jibri Schriefer, Bevelyn Buckles, MD  spironolactone (ALDACTONE) 25 MG tablet Take 1 tablet (25 mg total) by mouth daily. 04/01/18   [place] Ouch, Lennart Pall, PA-C  temazepam (RESTORIL) 15 MG capsule May take 1- 2 caps at bedtime as needed for sleep 05/29/18   Brodrick Curran, Bevelyn Buckles, MD  torsemide (DEMADEX) 20 MG tablet Take 2 tablets (40 mg total) by mouth daily. 05/29/18   Salar Molden, Bevelyn Buckles, MD  traMADol (ULTRAM) 50 MG tablet Take 1 tablet (50 mg total) by mouth 3 (three) times daily as needed. 04/07/18   Mickie Badders, Bevelyn Buckles, MD  venlafaxine XR (EFFEXOR XR) 37.5 MG 24 hr capsule  Take 1 capsule (37.5 mg total) by mouth daily. 05/09/18   Drema Dallas, DO    Past Medical History: Past Medical History:  Diagnosis Date  . Choreiform movements   . Fluid overload   . Headache    migraines  . Heart failure (HCC)   . Hypertension   . Lower extremity pain   . Sleep apnea    wears cpap  . Wears dentures     Past Surgical History: Past Surgical History:  Procedure Laterality Date  . ABDOMINAL HYSTERECTOMY    . ARTERY REPAIR Right 04/26/2018   Procedure: REPAIR RIGHT RADIAL ARTERY PSEUDOANEURYSM;  Surgeon: Nada Libman, MD;  Location: [facility] OR;  Service: Vascular;  Laterality: Right;  . CATARACT EXTRACTION W/ INTRAOCULAR LENS  IMPLANT, BILATERAL    . COLONOSCOPY    . CORONARY STENT INTERVENTION N/A 03/27/2018   Procedure: CORONARY STENT INTERVENTION;  Surgeon: Lyn Records, MD;  Location: [facility] INVASIVE CV LAB;  Service: Cardiovascular;  Laterality: N/A;  . RIGHT/LEFT HEART CATH AND CORONARY ANGIOGRAPHY N/A 03/27/2018   Procedure: RIGHT/LEFT HEART CATH AND CORONARY ANGIOGRAPHY;  Surgeon: Dolores Patty, MD;  Location: [facility] INVASIVE CV LAB;  Service: Cardiovascular;  Laterality: N/A;    Family History: Family History  Problem Relation Age of Onset  . Alzheimer's disease Mother   . Other Father        MVA  . Hypertension Brother     Social History: Social History   Socioeconomic History  . Marital status: Single    Spouse name: Not on file  . Number of children: 4  . Years of education: Not on file  . Highest education level: 12th grade  Occupational History  . Occupation: retired  Engineer, production  . Financial resource strain: Not on file  . Food insecurity:    Worry: Not on file    Inability: Not on file  . Transportation needs:    Medical: Not on file    Non-medical: Not on file  Tobacco Use  . Smoking status: Former Smoker    Types: Cigarettes  . Smokeless tobacco: Never Used  . Tobacco comment: quit smoking > 25 years ago  Substance and  Sexual Activity  . Alcohol use: Not Currently  . Drug use: Never  . Sexual activity: Not on file  Lifestyle  . Physical activity:    Days per week: Not on file    Minutes per session: Not on file  . Stress: Not on file  Relationships  . Social connections:    Talks on phone: Not on file    Gets together: Not on file    Attends religious service: Not on file    Active member of club or organization: Not on file    Attends meetings of clubs or organizations: Not on file    Relationship status: Not on file  Other Topics Concern  . Not on file  Social History Narrative   Patient is right-handed. She lives alone in a split level home. She occasionally drinks coffee. She has been going to P/T, she was walking and riding her stationary bike, has been unable to recently.    Allergies:  No Known Allergies  Objective:    Vital Signs:   Temp:  [98.9 F (37.2 C)-102.6 F (39.2 C)] 98.9 F (37.2 C) (10/22 0812) Pulse Rate:  [93-106] 99 (10/22 0745) Resp:  [12-17] 12 (10/22 0745) BP: (91-144)/(60-98) 104/60 (10/22 0745) SpO2:  [49 %-100 %] 100 % (10/22 0745) Weight:  [60.8 kg] 60.8 kg (10/22 0002)    Weight change: Filed Weights   06/20/18 0002  Weight: 60.8 kg    Intake/Output:  No intake or output data in the 24 hours ending 06/20/18 0918    Physical Exam    General:  Elderly and ill appearing. Uncomfortable.  HEENT: Normal Neck: Supple. JVP to jaw (12 cm +). Carotids 2+ bilat; no bruits. No lymphadenopathy or thyromegaly appreciated. Cor: PMI nondisplaced. Slightly tachy. Irregularly irregular. No rubs, gallops or murmurs. Lungs: Clear Abdomen: Soft, nontender, nondistended. No hepatosplenomegaly. No bruits or masses. Good bowel sounds. Extremities: No cyanosis, clubbing, or rash. Trace to 1+ edema.  Neuro: Alert to person. Cranial nerves grossly intact.  moves all 4 extremities w/o difficulty. Affect pleasant  Telemetry   90-100s, ? AFL, personally reviewed.    EKG    Read as NSR 98 bpm, but ? AFL  Labs   Basic Metabolic Panel: Recent Labs  Lab 05/31/2018 2335 06/20/18 0304  NA 130* 132*  K 3.9 3.7  CL 101 103  CO2 21* 22  GLUCOSE 201* 112*  BUN 17 15  CREATININE 1.04* 1.01*  CALCIUM 7.7* 7.6*    Liver Function Tests: Recent Labs  Lab 06/24/2018 2335 06/20/18 0304  AST 114* 108*  ALT 73* 67*  ALKPHOS 128* 124  BILITOT 1.0 1.0  PROT 6.5 6.3*  ALBUMIN 2.9* 2.7*   No results for input(s): LIPASE, AMYLASE in the last 168 hours. Recent Labs  Lab 06/06/2018 2335  AMMONIA 49*    CBC: Recent Labs  Lab 06/29/2018 2335 06/20/18 0304  WBC 4.0 4.7  NEUTROABS 3.1  --   HGB 10.3* 10.1*  HCT 32.6* 32.0*  MCV 99.7 98.2  PLT 176 148*    Cardiac Enzymes: No results for input(s): CKTOTAL, CKMB, CKMBINDEX, TROPONINI in the last 168 hours.  BNP: BNP (last 3 results) Recent Labs    03/24/18 1457  BNP 2,425.8*    ProBNP (last 3 results) No results for input(s): PROBNP in the last 8760 hours.   CBG: Recent Labs  Lab 06/20/18 0149 06/20/18 0439 06/20/18 0806  GLUCAP 99 81 71    Coagulation Studies: Recent Labs    06/20/18 0304  LABPROT 21.9*  INR 1.94     Imaging   Dg Chest Port 1 View  Result Date: 06/06/2018 CLINICAL DATA:  Heart failure, fever EXAM: PORTABLE CHEST 1 VIEW COMPARISON:  03/24/2018, CT chest 03/29/2018 FINDINGS: Small left greater than right pleural effusions. Cardiomegaly with vascular congestion and diffuse interstitial edema. Consolidation at the left base. Aortic atherosclerosis. No pneumothorax. IMPRESSION: 1. Cardiomegaly with vascular congestion and mild interstitial edema. Small left greater than right pleural effusions. 2. Nausea dense left lung base consolidation, atelectasis versus pneumonia Electronically Signed   By: Jasmine Pang M.D.   On: 06/18/2018 23:14      Medications:     Current Medications:   Infusions: . amiodarone 15 mg/hr (06/20/18 0839)  . dextrose 5 % and  0.9% NaCl 30 mL/hr at 06/20/18 0430  . heparin 900 Units/hr (06/20/18 0244)  . piperacillin-tazobactam (ZOSYN)  IV 3.375 g (06/20/18 0631)  . [START ON 09-Jul-2018] vancomycin        Patient Profile   [name] Rosales is a 77 y.o. female with h/o HTN, systolic CHF, LBBB, and OSA.   Admitted to [facility] in [place], [place] on 10/18 with AMS and acute hypoxic resp failure. Transferred to [facility] 06/16/2018 for further evaluation and treatment.   Assessment/Plan   1. Sepsis of unclear Etiology - Febrile to 102.6 overnight, though WBC 4.7. - BCx reportedly NGTD from [facility].  - CXR 06/07/2018 with dense left lung base consolidation. CT scan at outside hospital reportedly with "pulmonary edema" - Continue vanc/zosyn. 2. Metabolic encephalopathy - Ammonia 49, AST/ALT up to 108/67. Total Bili 1.0 - Not far from baseline this am per son.  3. 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 Differential also includes ischemic CM, infiltrative CM (amyloid) and CTD-related PAH/HF. Admitted with massive volume overload and NYHA IIIB-IV symptoms.cMRI 03/28/18 with LVEF 40% and  no LGE. Echo 03/25/18 EF 35%. - Appears volume overloaded on exam.  - Will discuss lasix with MD in setting of sepsis. BP has room.  - BP controlled currently off Entresto, coreg, and hydral in setting of likely sepsis.  4. Severe HTN: - Will need to resume meds as tolerated. Controlled currently. 5. CAD: - 03/27/18 L/RHC showed 1v CAD with high grade mRCA stenosis (otherwise non-obstructive CAD) with mild pulmonary HTN with prominent V waves suggestive MR vs significant diastolic dysfunction. - Troponin peaked at 0.038 by report. Suspect demand ischemia in setting of sepsis/hypotension. No overt chest pain.  - Continue Plavix and Eliquis. Off ASA - Will need plavix for at least 6  months. 6. PAF/AFL , new onset: - She has had both atrial fib and flutter. She remains in AFL today. Rate ok .  - On Eliquis chronically. Continue heparin gtt for now.  - Continue amio gtt. Rate controlled this am. May be able to switch back to po as tolerated. TEE/DC-CV in near future 7. Facial rash/?possible lups: - Reviewed with Dermatology during recent admission. 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 + Ro and La antibodies suggestive of possible Sjogrens.  P-ANCA initially elevated to 1:80. Negative on recheck dsDNA negative - Dr. Gala Romney discussed with Dr. Dierdre Forth in Rheum. We placed her on steroid taper previously and she responded dramatically. Dr. Dierdre Forth saw her in f/u and he felt that symptoms had resolved and no clear rheumatologic diagnosis currently.  - With worsening symptoms she is now back on prednisone. We will gauge response. May be contributing to volume overload.  8. LBBB: - Consider CRTdepending on LV EF in future pending course.  - LBBB 168 ms on EKG today.  9. OSA: - Continue nightly CPAP.  10. Migraines:  - Seen by Dr. Everlena Cooper and Dilantin changed to Effexor 11. Hyponatremia: - Na 132 today. Follow.  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 DDx is broad but includes phenytoin-induced chorea and hyponatremia. MRI brain 03/28/18 with acute occipital infarct and chronic microvascular changesin setting of atrial fibrillation. Heavy metals negative.IgG + at >1800. (Normal 928 780 2808 mg/dL).CT Chest/Abdomen/Pelvis 03/29/18 with no evidence for malignancy. ? Worsened by fatigue and anxiety - Seen by Dr. Everlena Cooper and Dilantin changed to Effexor but she has not made the change yet.  - Follow.  13. ICA aneurysm: - Incidental finding. Felt to be small.  - Will need repeat CTA in February 2020. No cahnge.  14. 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 Now on keflex for post-op wound 15. Insomnia - OK to use temazepam 15 mg qhs prn.  Medication concerns reviewed with patient and pharmacy team. Barriers identified: None at this time.   Length of Stay: 1  Luane School  06/20/2018, 9:18 AM  Advanced Heart Failure Team Pager [phone] (M-F; 7a - 4p)  Please contact [facility] for night-coverage after hours (4p -7a ) and weekends on [email or web address]  Patient seen and examined with the above-signed Advanced Practice Provider and/or Housestaff. I personally reviewed laboratory data, imaging studies and relevant notes. I independently examined the patient and formulated the important aspects of the plan. I have edited the note to reflect any of my changes or salient points. I have personally discussed the plan with the patient and/or family.  Unfortunately she continues to deteriorate and although she  does have some degree of HF and volume overload, I do not think this accounts for her dramatic deterioration over the past few months. Now appears to have severe hypoglycemia and mental status changes in the setting of possible sepsis but PCT is low.   I continue to worry that we are missing the unifying diagnosis here. I have been worried about an underlying vasculitic picture but she has been seen by Rheumatology and felt no clear diagnosis. Previous brain MRI without evidence of vasculitis.   Will start IV lasix. Repeat echo and liver u/s given mildly elevated ammonia. Appreciate IM teams care. Her family realizes how ill she is.   Arvilla Meres, MD  3:46 PM

## 2018-06-20 NOTE — Progress Notes (Signed)
RT NOTE: RT called to room by RN due to patient desating. Upon arrival to room patient on non-rebreather mask with sats not picking up on monitor due to poor perfusion. After several sat probes applied RT achieved good waveform with sat in 90's. Patient's blood glucose was noted to be in the 30's. MD Dr. Sharon Seller came and assessed patient. Per MD patient's glucose cause of issues currently. Per MD no ABG is necessary at this time. Vitals are stable. RT will continue to monitor.

## 2018-06-20 NOTE — Evaluation (Addendum)
Clinical/Bedside Swallow Evaluation Patient Details  Name: [name] Rosales MRN: [id number] Date of Birth: [birth year]  Today's Date: 06/20/2018 Time: SLP Start Time (ACUTE ONLY): 1404 SLP Stop Time (ACUTE ONLY): 1425 SLP Time Calculation (min) (ACUTE ONLY): 21 min  Past Medical History:  Past Medical History:  Diagnosis Date  . Choreiform movements   . Fluid overload   . Headache    migraines  . Heart failure (HCC)   . Hypertension   . Lower extremity pain   . Sleep apnea    wears cpap  . Wears dentures    Past Surgical History:  Past Surgical History:  Procedure Laterality Date  . ABDOMINAL HYSTERECTOMY    . ARTERY REPAIR Right 04/26/2018   Procedure: REPAIR RIGHT RADIAL ARTERY PSEUDOANEURYSM;  Surgeon: Nada Libman, MD;  Location: [facility] OR;  Service: Vascular;  Laterality: Right;  . CATARACT EXTRACTION W/ INTRAOCULAR LENS  IMPLANT, BILATERAL    . COLONOSCOPY    . CORONARY STENT INTERVENTION N/A 03/27/2018   Procedure: CORONARY STENT INTERVENTION;  Surgeon: Lyn Records, MD;  Location: [facility] INVASIVE CV LAB;  Service: Cardiovascular;  Laterality: N/A;  . RIGHT/LEFT HEART CATH AND CORONARY ANGIOGRAPHY N/A 03/27/2018   Procedure: RIGHT/LEFT HEART CATH AND CORONARY ANGIOGRAPHY;  Surgeon: Dolores Patty, MD;  Location: [facility] INVASIVE CV LAB;  Service: Cardiovascular;  Laterality: N/A;   HPI:  [name] is a 77 y.o. female with PMH significant for HTN, migraine, sleep apnea, heart failure, was admitted to [facility] in [place] on 06/16/18 with dx acute hypoxic respiratory failure (had altered mental status after cardiac rehab, found to have O2 sats in the 30s and was cyanotic glucose was also be found to be less than 50). She was transferred to [facility] 06/25/2018. Per chart, she was seen for swallowing with concern for aspiration at [facility]. CXR revealed cardiomegaly, mild interstitial edema, small left greater than right pleural effusions, nausea dense  left lung base consolidation, atelectasis versus pneumonia. Head CT pending.   Assessment / Plan / Recommendation Clinical Impression  Pt's oral dysphagia appears related to cognitive changes, fluctuating alertness and overall generalized weakness. She preferred to keep eyes closed and was restless throughout evaluation. She followed commands and consumed po's with apparent internal distractions due to overall pain (received pain meds recently) with decreased ability to sustain attention for extended periods of time. Unable to consecutively intake 3 oz water without breaks due to aforemntioned. No cough or throat clear but with trace wet vocal quality x 1. Oral impairments primarily due to cognitive and generalized weakness with anterior spill and slower transit. Solid texture not appropriate at present and not assessed. Respiratory status and need for HFNC increases aspiration risk.  Recommend continue clear liquids (thin) using cautins, straws, pills crushed, Reiterated importance of upright positioning and alertness with son prior to eating/drinking. Will continue to see for safe intake and ability to upgrade when able.      SLP Visit Diagnosis: Dysphagia, unspecified (R13.10)    Aspiration Risk  Moderate aspiration risk;Mild aspiration risk    Diet Recommendation Thin liquid(clear liquids)   Liquid Administration via: Cup;Straw Medication Administration: Crushed with puree Supervision: Patient able to self feed;Staff to assist with self feeding;Full supervision/cueing for compensatory strategies Compensations: Minimize environmental distractions;Slow rate;Small sips/bites;Lingual sweep for clearance of pocketing Postural Changes: Seated upright at 90 degrees    Other  Recommendations Oral Care Recommendations: Oral care BID   Follow up Recommendations (TBD)  Frequency and Duration min 2x/week  2 weeks       Prognosis Prognosis for Safe Diet Advancement: Good      Swallow Study    General HPI: [name] is a 77 y.o. female with PMH significant for HTN, migraine, sleep apnea, heart failure, was admitted to [facility] in [place] on 06/16/18 with dx acute hypoxic respiratory failure (had altered mental status after cardiac rehab, found to have O2 sats in the 30s and was cyanotic glucose was also be found to be less than 50). She was transferred to [facility] 06/20/2018. Per chart, she was seen for swallowing with concern for aspiration at [facility]. CXR revealed cardiomegaly, mild interstitial edema, small left greater than right pleural effusions, nausea dense left lung base consolidation, atelectasis versus pneumonia. Head CT pending. Type of Study: Bedside Swallow Evaluation Previous Swallow Assessment: (none found) Diet Prior to this Study: Thin liquids(clear liquids) Temperature Spikes Noted: Yes Respiratory Status: Other (comment)(HFNC 10L) History of Recent Intubation: No Behavior/Cognition: Lethargic/Drowsy;Cooperative;Requires cueing Oral Cavity Assessment: Dry Oral Care Completed by SLP: Yes Oral Cavity - Dentition: Dentures, top(upper plate ill fitting, some natural lower) Vision: (difficult to assess) Self-Feeding Abilities: Needs set up;Needs assist Patient Positioning: Upright in bed Baseline Vocal Quality: Low vocal intensity Volitional Cough: Strong    Oral/Motor/Sensory Function Overall Oral Motor/Sensory Function: Generalized oral weakness Facial ROM: Within Functional Limits Facial Symmetry: Within Functional Limits Facial Strength: (generally weak) Lingual ROM: Other (Comment)(decreased on left.) Lingual Symmetry: Within Functional Limits   Ice Chips Ice chips: Not tested   Thin Liquid Thin Liquid: Impaired Presentation: Cup;Straw Oral Phase Impairments: Reduced labial seal Oral Phase Functional Implications: Left anterior spillage;Right anterior spillage    Nectar Thick Nectar Thick Liquid: Not tested   Honey  Thick Honey Thick Liquid: Not tested   Puree Puree: Impaired Presentation: Spoon Oral Phase Impairments: Reduced lingual movement/coordination Oral Phase Functional Implications: (reduced manipulation) Pharyngeal Phase Impairments: (none)   Solid     Solid: Not tested      Royce Macadamia 06/20/2018,2:57 PM  Breck Coons Lonell Face.Ed Nurse, children's [phone] Office [phone]

## 2018-06-20 NOTE — Progress Notes (Signed)
RT called to bedside r/t pt sats in the 70's. Replaced O2 sat probe and cord until a good pleth was acquired. Pts fingers very cyanotic MD is aware. Pt was originally on a NRB when sats declined, pt not wanting to keep mask on. RT placed pt on a HFNC at 10lpm with a sat of 100%. RT will continue to monitor.

## 2018-06-20 NOTE — Progress Notes (Signed)
RN in room helping pt with restroom needs. RN noticed lethargic response of pt when getting up as well as poor O2 sat, and distal cyanotic changes to fingers and toes. Pt demonstrated increased work of breathing and "air hunger" restlessness. Pt placed on Non rebreather from HFNC (12L). No visible improvement of sats on monitor. Checked BS: 32. Provided 1 amp of D50 IV-BS improved to 70.  Paged RT and Dr Sharon Seller. RT to bedside-see note. Multiple changes of sat probe to get a good reading. Best reading was 90's however it did not stay. Kept pt on non rebreather. New orders provided. Will continue to monitor.  17:20 Rechecked BS dropped again to 43, provided another amp of D50.   17:30 RR 31's, HR 105, pt still demonstrating increased work of breathing and restlessness.  Spoke with Dr. Sharon Seller about findings. New orders provided.   17:41 Provided 1mg  of morphine IV.   18:00 Rechecked BS 41, provided with another amp of D50.  Will recheck BS in 15-20 minutes to reassess.   18:20 Family called RN into the room, pt is restless and unable to sit still stating she is unable to be comfortable and "please let me go". Repeated BS was 30, provided 2 more amps of D50. Notified Dr Sharon Seller of changes. Critical care called.   18:40 Critical care, Dr Sharon Seller at bedside with daughter and son. Discussed current status and daughter states she wants to be comfortable, son reports she does not want to be intubated. New orders provided for morphine IV push and gtt to be started.    RN carried out new orders given. Awaiting morphine gtt, will start once it arrives. Family is at bedside and aware of plan of care.

## 2018-06-20 NOTE — Progress Notes (Addendum)
ANTICOAGULATION CONSULT NOTE   Pharmacy Consult for heparin and Vancocin/Zosyn Indication: atrial fibrillation and aspiration pneumonia/bacteremia  No Known Allergies  Patient Measurements: Height: 5\' 6"  (167.6 cm) Weight: 134 lb (60.8 kg) IBW/kg (Calculated) : 59.3  HEPARIN DW (KG): 60.8  Vital Signs: Temp: 97.6 F (36.4 C) (10/22 1115) Temp Source: Axillary (10/22 1115) BP: 102/65 (10/22 1115) Pulse Rate: 96 (10/22 1115)  Labs: Recent Labs    06/30/18 2335 06/20/18 0304 06/20/18 0930  HGB 10.3* 10.1*  --   HCT 32.6* 32.0*  --   PLT 176 148*  --   APTT  --   --  122*  LABPROT  --  21.9*  --   INR  --  1.94  --   HEPARINUNFRC  --   --  1.82*  CREATININE 1.04* 1.01*  --     Estimated Creatinine Clearance: 43.7 mL/min (A) (by C-G formula based on SCr of 1.01 mg/dL (H)).   Medical History: Past Medical History:  Diagnosis Date  . Choreiform movements   . Fluid overload   . Headache    migraines  . Heart failure (HCC)   . Hypertension   . Lower extremity pain   . Sleep apnea    wears cpap  . Wears dentures     Assessment: 77yo female transferred from [facility] for toxic metabolic encephalopathy, unable to take oral medications, to transition from Eliquis for Afib to UFH (last dose Eliquis appears to be 10/21 at 0930), and to continue IV ABX for aspiration pneumonia and bacteremia (was on vanc 1g Q24, last dose 10/20 2200, and cefepime 1g Q8 last dose 10/21 1000, at OSH).  10/22 AM aPTT is supratherapeutic at 122, heparin level is 1.82. Heparin level not currently correlating due to recent dose of apixaban. Will continue to dose based off aPTT for now. No current bleeding or infusion issues per RN. CBC stable; Hgb 10.1, plt 148.  Goal of Therapy:  Heparin level 0.3-0.7 units/ml aPTT 66-102 seconds  Vanc trough 15-20 mg/L Monitor platelets by anticoagulation protocol: Yes   Plan:  Decrease heparin gtt to 700 units/hr Will order ~8hr heparin  level/aPTT Monitor daily heparin levels, aPTT, and CBC.  Thank you for involving pharmacy in this patient's care.  Wendelyn Breslow, PharmD PGY1 Pharmacy Resident Phone: [phone] 06/20/2018 11:23 AM

## 2018-06-20 NOTE — Progress Notes (Signed)
  Speech Language Pathology  Patient Details Name: [name] Rosales MRN: [id number] DOB: 1941-01-22 Today's Date: 06/20/2018 Time:  -      Attempted swallow assessment. Per RN pt did not sleep much last night and is currently lethargic. ST will return later today.                     Royce Macadamia 06/20/2018, 9:05 AM   Breck Coons Lonell Face.Ed Nurse, children's [phone] Office [phone]

## 2018-06-20 NOTE — Evaluation (Signed)
Physical Therapy Evaluation Patient Details Name: [name] Rosales MRN: [id number] DOB: 12/18/1940 Today's Date: 06/20/2018   History of Present Illness  Pt adm to hospital in [place] for hypoxic respiratory failure. Pt transferred to [facility] on 10/21 and also with heart failure. PMH -  HTN, CAD s/p RCA PCI 03/27/18, systolic CHF, LBBB, h/o Afib/Flutter, myclonic seizure, h/o radial artery PSA s/p repair 04/26/18  Clinical Impression  Pt presents to PT extremely frail and weak. Expect pt will need extended time to recover and recommend ST-SNF.     Follow Up Recommendations SNF    Equipment Recommendations  Other (comment)(To be determined)    Recommendations for Other Services       Precautions / Restrictions Precautions Precautions: Fall Restrictions Weight Bearing Restrictions: No      Mobility  Bed Mobility Overal bed mobility: Needs Assistance Bed Mobility: Supine to Sit;Sit to Supine     Supine to sit: Mod assist;HOB elevated Sit to supine: Max assist;HOB elevated   General bed mobility comments: Assist to bring legs off of bed, elevate trunk into sitting, and bring hips to EOB. Assist to lower trunk to supine and bring legs back up in bed  Transfers                 General transfer comment: Did not attempt due to fatigue   Ambulation/Gait             General Gait Details: Did not attempt due to fatigue   Stairs            Wheelchair Mobility    Modified Rankin (Stroke Patients Only)       Balance Overall balance assessment: Needs assistance Sitting-balance support: Bilateral upper extremity supported;Feet supported Sitting balance-Leahy Scale: Poor Sitting balance - Comments: Sat EOB x 7-8 minutes with min assist. Pt with flexed posture.                                     Pertinent Vitals/Pain Pain Assessment: Faces Faces Pain Scale: Hurts even more Pain Location: all over Pain Descriptors / Indicators:  Grimacing;Restless Pain Intervention(s): Limited activity within patient's tolerance;Monitored during session;Repositioned    Home Living Family/patient expects to be discharged to:: Private residence Living Arrangements: Alone Available Help at Discharge: Family Type of Home: House Home Access: Stairs to enter Entrance Stairs-Rails: None Entrance Stairs-Number of Steps: 3 w/ no rail in front; level entry in garage, but then up 13 steps w/ rail to main level Home Layout: Two level        Prior Function Level of Independence: Needs assistance   Gait / Transfers Assistance Needed: Independent with ambulation           Hand Dominance        Extremity/Trunk Assessment   Upper Extremity Assessment Upper Extremity Assessment: Generalized weakness    Lower Extremity Assessment Lower Extremity Assessment: RLE deficits/detail;LLE deficits/detail RLE Deficits / Details: grossly 3-/5 LLE Deficits / Details: grossly 3-/5    Cervical / Trunk Assessment Cervical / Trunk Assessment: Kyphotic;Other exceptions Cervical / Trunk Exceptions: Poor trunk control  Communication   Communication: No difficulties  Cognition Arousal/Alertness: Awake/alert Behavior During Therapy: Anxious;Restless Overall Cognitive Status: Impaired/Different from baseline Area of Impairment: Following commands;Attention                   Current Attention Level: Sustained   Following Commands: Follows one step commands with  increased time              General Comments General comments (skin integrity, edema, etc.): VSS. Pt on hi flow nasal cannula    Exercises     Assessment/Plan    PT Assessment Patient needs continued PT services  PT Problem List Decreased strength;Decreased activity tolerance;Decreased balance;Decreased mobility;Pain       PT Treatment Interventions DME instruction;Gait training;Functional mobility training;Therapeutic activities;Therapeutic exercise;Balance  training;Patient/family education    PT Goals (Current goals can be found in the Care Plan section)  Acute Rehab PT Goals Patient Stated Goal: not stated PT Goal Formulation: With patient Time For Goal Achievement: 07/04/18 Potential to Achieve Goals: Fair    Frequency Min 2X/week   Barriers to discharge Decreased caregiver support;Inaccessible home environment Lives alone and multilevel home    Co-evaluation               AM-PAC PT "6 Clicks" Daily Activity  Outcome Measure Difficulty turning over in bed (including adjusting bedclothes, sheets and blankets)?: Unable Difficulty moving from lying on back to sitting on the side of the bed? : Unable Difficulty sitting down on and standing up from a chair with arms (e.g., wheelchair, bedside commode, etc,.)?: Unable Help needed moving to and from a bed to chair (including a wheelchair)?: Total Help needed walking in hospital room?: Total Help needed climbing 3-5 steps with a railing? : Total 6 Click Score: 6    End of Session Equipment Utilized During Treatment: Oxygen Activity Tolerance: Patient limited by fatigue Patient left: in bed;with call bell/phone within reach;with family/visitor present Nurse Communication: Mobility status PT Visit Diagnosis: Other abnormalities of gait and mobility (R26.89);Difficulty in walking, not elsewhere classified (R26.2);Muscle weakness (generalized) (M62.81)    Time: 1610-9604 PT Time Calculation (min) (ACUTE ONLY): 15 min   Charges:   PT Evaluation $PT Eval Moderate Complexity: 1 Mod          [facility] PT Acute Rehabilitation Services Pager [phone] Office [phone]   [name] Rosales [facility] 06/20/2018, 3:41 PM

## 2018-06-20 NOTE — Progress Notes (Signed)
seens and reviewed [facility] Nursing tells me sats not picking up He rhands are cold--my suspicion is she has poor perfusion overall from her CHF--later this am she might benefit from discussion with HF team re: milrinone?--I think she has stabilized to some degree but is still tensuous and I expaline dhtis to family Await source of infeciton cut back IVF to 30 cc/h to ensure no excess vol Hold diuretics for now

## 2018-06-21 ENCOUNTER — Encounter (HOSPITAL_COMMUNITY): Payer: Self-pay | Admitting: *Deleted

## 2018-06-21 DIAGNOSIS — R7989 Other specified abnormal findings of blood chemistry: Secondary | ICD-10-CM

## 2018-06-21 DIAGNOSIS — E161 Other hypoglycemia: Secondary | ICD-10-CM

## 2018-06-21 DIAGNOSIS — G92 Toxic encephalopathy: Secondary | ICD-10-CM

## 2018-06-21 DIAGNOSIS — G9340 Encephalopathy, unspecified: Secondary | ICD-10-CM

## 2018-06-21 DIAGNOSIS — R6510 Systemic inflammatory response syndrome (SIRS) of non-infectious origin without acute organ dysfunction: Secondary | ICD-10-CM

## 2018-06-21 DIAGNOSIS — A419 Sepsis, unspecified organism: Principal | ICD-10-CM

## 2018-06-21 LAB — GLUCOSE, CAPILLARY: Glucose-Capillary: 84 mg/dL (ref 70–99)

## 2018-06-21 MED ORDER — DEXTROSE 50 % IV SOLN
INTRAVENOUS | Status: AC
  Administered 2018-06-21: 25 mL
  Filled 2018-06-21: qty 50

## 2018-06-21 MED ORDER — MIDAZOLAM BOLUS VIA INFUSION
5.0000 mg | Freq: Once | INTRAVENOUS | Status: AC
  Administered 2018-06-21: 5 mg via INTRAVENOUS
  Filled 2018-06-21: qty 5

## 2018-06-21 MED ORDER — MORPHINE BOLUS VIA INFUSION
5.0000 mg | Freq: Once | INTRAVENOUS | Status: AC
  Administered 2018-06-21: 5 mg via INTRAVENOUS
  Filled 2018-06-21: qty 5

## 2018-06-21 MED ORDER — ATROPINE SULFATE 1 % OP SOLN
4.0000 [drp] | OPHTHALMIC | Status: DC | PRN
  Filled 2018-06-21: qty 2

## 2018-06-21 MED ORDER — SODIUM CHLORIDE 0.9 % IV SOLN
5.0000 mg/h | INTRAVENOUS | Status: DC
  Administered 2018-06-21: 0.5 mg/h via INTRAVENOUS
  Administered 2018-06-21: 5 mg/h via INTRAVENOUS
  Filled 2018-06-21 (×2): qty 10

## 2018-06-21 MED FILL — Dextrose Inj 50%: INTRAVENOUS | Qty: 150 | Status: AC

## 2018-06-22 ENCOUNTER — Inpatient Hospital Stay (HOSPITAL_COMMUNITY): Admission: RE | Admit: 2018-06-22 | Payer: Medicare PPO | Source: Ambulatory Visit | Admitting: Internal Medicine

## 2018-06-22 NOTE — Progress Notes (Signed)
Postmortem care completed.  IV Morphine and Versed wasted in med room and witnessed by [facility].  Patient placement notified.

## 2018-06-30 NOTE — Progress Notes (Addendum)
Advanced Heart Failure Rounding Note  PCP-Cardiologist: Glori Bickers, MD   Subjective:    06/20/18 had continuing issues with hypoxia, increased work of breathing, and hypoglycemia. MDs met with family and pt last night and comfort care now being pursued.   Pt uncomfortable this am. On morphine gtt. Awake this am. She denies pain. Her son will be here in less than 2 hours, and family and patient don't want her to be anymore sleepy until he comes. Pt states she is OK right now.   Objective:   Weight Range: 60.8 kg Body mass index is 21.63 kg/m.   Vital Signs:   Temp:  [97.5 F (36.4 C)-98.9 F (37.2 C)] 97.5 F (36.4 C) (10/22 1400) Pulse Rate:  [95-99] 96 (10/23 0732) Resp:  [12-22] 14 (10/23 0732) BP: (102-129)/(60-76) 105/67 (10/22 1400) SpO2:  [93 %-100 %] 93 % (10/23 0732)    Weight change: Filed Weights   06/20/18 0002  Weight: 60.8 kg    Intake/Output:   Intake/Output Summary (Last 24 hours) at 07/08/18 0742 Last data filed at 06/20/2018 1510 Gross per 24 hour  Intake 1367.64 ml  Output 500 ml  Net 867.64 ml      Physical Exam    General:  Elderly, frail. Uncomfortable.  HEENT: Normal Neck: Supple. JVP to jaw. Carotids 2+ bilat; no bruits. No lymphadenopathy or thyromegaly appreciated. Cor: PMI nondisplaced. Slightly tachy, Irregularly irregular.  No rubs, gallops or murmurs. Lungs: Diminished throughout.  Abdomen: Soft, nontender, nondistended. No hepatosplenomegaly. No bruits or masses. Good bowel sounds. Extremities: No cyanosis, clubbing, or rash. Trace to 1+ edema. Cool to the touch.  Neuro: Awake and alert to person.   Telemetry   Not connected  EKG    No new tracings.    Labs    CBC Recent Labs    06/29/2018 2335 06/20/18 0304  WBC 4.0 4.7  NEUTROABS 3.1  --   HGB 10.3* 10.1*  HCT 32.6* 32.0*  MCV 99.7 98.2  PLT 176 884*   Basic Metabolic Panel Recent Labs    05/30/2018 2335 06/20/18 0304  NA 130* 132*  K 3.9 3.7    CL 101 103  CO2 21* 22  GLUCOSE 201* 112*  BUN 17 15  CREATININE 1.04* 1.01*  CALCIUM 7.7* 7.6*   Liver Function Tests Recent Labs    06/28/2018 2335 06/20/18 0304  AST 114* 108*  ALT 73* 67*  ALKPHOS 128* 124  BILITOT 1.0 1.0  PROT 6.5 6.3*  ALBUMIN 2.9* 2.7*   No results for input(s): LIPASE, AMYLASE in the last 72 hours. Cardiac Enzymes No results for input(s): CKTOTAL, CKMB, CKMBINDEX, TROPONINI in the last 72 hours.  BNP: BNP (last 3 results) Recent Labs    03/24/18 1457  BNP 2,425.8*    ProBNP (last 3 results) No results for input(s): PROBNP in the last 8760 hours.   D-Dimer No results for input(s): DDIMER in the last 72 hours. Hemoglobin A1C No results for input(s): HGBA1C in the last 72 hours. Fasting Lipid Panel No results for input(s): CHOL, HDL, LDLCALC, TRIG, CHOLHDL, LDLDIRECT in the last 72 hours. Thyroid Function Tests Recent Labs    06/20/18 0304  TSH 2.446    Other results:   Imaging     No results found.   Medications:     Scheduled Medications: .  morphine injection  2 mg Intravenous Once     Infusions: . amiodarone 15 mg/hr (06/20/18 1500)  . dextrose 40 mL/hr at 06/20/18  1717  . morphine 3 mg/hr (2018-06-28 0702)  . piperacillin-tazobactam (ZOSYN)  IV 3.375 g (28-Jun-2018 2637)  . vancomycin 1,000 mg (Jun 28, 2018 0007)     PRN Medications:  morphine injection    Patient Profile   [name] Rosales is a 77 y.o. female with h/o HTN, systolic CHF, LBBB, and OSA.   Admitted to [facility] in [place], [place] on 10/18 with AMS and acute hypoxic resp failure. Transferred to [facility] 06/07/2018 for further evaluation and treatment.   Assessment/Plan   1. Sepsis of unclear Etiology - Tmax 98.9. No labs this am, as now on moprhine gtt.  - BCx reportedly NGTD from [facility].  - CXR 06/05/2018 with dense left lung base consolidation. CT scan at outside hospital reportedly with "pulmonary edema" - Remains on vanc/zosyn. Can likely stop if  full comfort care being pursued.  2. Metabolic encephalopathy - Ammonia 49, AST/ALT up to 108/67. Total Bili 1.0 on admission.  - Hypoglycemia contributing.  - Not far from baseline this am per son.  3. 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 Differential also includes ischemic CM, infiltrative CM (amyloid) and CTD-related PAH/HF. Admitted with massive volume overload and NYHA IIIB-IV symptoms.cMRI 03/28/18 with LVEF 40% and no LGE. Echo 03/25/18 EF 35%. - Poor response to 1 dose IV lasix, had worsening sats and work of breathing throughout the day yesterday.  - Now on morphine gtt.  4. Severe HTN: - No change, as above.  5. CAD: - 03/27/18 L/RHC showed 1v CAD with high grade mRCA stenosis (otherwise non-obstructive CAD) with mild pulmonary HTN with prominent V waves suggestive MR vs significant diastolic dysfunction. - Troponin peaked at 0.038 by report. Suspect demand ischemia in setting of sepsis/hypotension. No overt chest pain.  - Off Plavix and Eliquis with acute illness. Off ASA 6. PAF/AFL , new onset: - She has had both atrial fib and flutter. Remains in AFL this admission.  - Had been on Eliquis chronically.  - Continue amio gtt for comfort. Now on morphine gtt.  7. Facial rash/?possible lups: - Reviewed with Dermatology during recent admission. 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 + Ro and La antibodies suggestive of possible Sjogrens. P-ANCA initially elevated to 1:80. Negative on recheck dsDNA negative - Dr. Haroldine Laws previously discussed with Dr. Amil Amen in Rheum. We placed her on steroid taperpreviously and she responded dramatically. Dr. Amil Amen saw her in f/u and he felt that  symptoms had resolved and no clear rheumatologic diagnosis currently. - With worsening symptoms she is now back on prednisone. We will gauge response. May be contributing to volume overload.  8. LBBB: - LBBB 168 ms on admit.  9. OSA: - Uses CPAP. No change.  10. Hyponatremia: - No labs today with comfort care.  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 Now on comfort care.  13. ICA aneurysm: - Incidental finding. Felt to be small.  - Had planned repeat CTA in February 2020. .  14. 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 Now on keflex for post-op wound. Stable.  15. Insomnia - Now on comfort care.   On morphine gtt for comfort. Awaiting family arrival. Offered to add versed as pt having intermittent SOB, but  pt declines and says she is OK right now. She does not want to be any more "sleepy" until her son arrives.   Medication concerns reviewed with patient and pharmacy team. Barriers identified: None at this time.   Length of Stay: 2  Annamaria Helling  30-Jun-2018, 7:42 AM  Advanced Heart Failure Team Pager [phone] (M-F; 7a - 4p)  Please contact [facility] for night-coverage after hours (4p -7a ) and weekends on [email or web address]  Patient seen and examined with the above-signed Advanced Practice Provider and/or Housestaff. I personally reviewed laboratory data, imaging studies and relevant notes. I independently examined the patient and formulated the important aspects of the plan. I have edited the note to reflect any of my changes or salient points. I have personally discussed the plan with the patient and/or family.  Agree with above. Has transitioned to comfort care and now on low-dose morphine. Still a bit uncomfortable this am but IM addressing. Can add versed or ativan as needed. Appreciate IM care.   Glori Bickers, MD  9:10 AM

## 2018-06-30 NOTE — Progress Notes (Signed)
PROGRESS NOTE  [name] Rosales WUJ:[id number] DOB: 1940-12-20 DOA: 06/18/2018 PCP: System, Pcp Not In  HPI/Recap of past 24 hours: 77 y.o.female w/ a hx of HTN, CAD s/p RCA PCI 03/27/18, systolic CHF, LBBB, Afib/Flutter, myclonic seizure, radial artery PSA s/p repair 04/26/18, and OSA who was admitted to [facility] in [place], [place] 06/16/18 for AMS after she presented to cardiac rehab cyanotic and hypoglycemic. On admission there, Cr 1.19, BUN 41, Na 120, and lactate 4.7. UA negative. Glucose 28 initially. Admitted for acute hypoxic respiratory failure and started on Vanc/Cefepime. CT scan showed pulmonary edema but no PE. Troponins increased from 0.018 ->0.038 and family requested transfer to [facility], where she arrived on 10/21.  Following transfer, patient remained lethargic for the past 24-48 hours with intermittent episodes of severe recurrent hypoglycemia on 10/22 as well as episodes of being obtunded, hypoventilation with hypoxia.  Patient remained air hungry and despite use of nonrebreather and multiple amps of D50, patient continued to have hypoxia and hypoglycemia.  On evening of 10/22, family had discussion with attending physician with decision for patient to be made comfortable.  Started on morphine drip.  Overnight, has had some episodes of hypoglycemia, doing better on D10 drip.  Even with oxygenation, action saturations are in the mid 70s at that time, dipped down into the 40s.  Patient states that she is tired.  Assessment/Plan: SIRS v/s Sepsis of unclear etiology with severe recurrent refractory hypoglycemia CXR suggestive of possible LLL PNA - on Vanc and Zosyn empirically - clinically my suspicion for an active infection is low - I suspect her profound idiopathic hypoglycemia may be to blame for her presentation.  Reverse may also be true with underlying infection causing this recurrent hypoglycemia.  Patient is now comfort care.  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  Have stopped checking labs  HTN  CAD with left bundle branch block   Parox Afib: Rate controlled.  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   Code Status: DNR, no comfort care  Family Communication: Daughter is at the bedside  Disposition Plan: Patient on comfort care, I expect that she will likely pass within the next 24 hours   Consultants:  Cardiology/heart failure team  Procedures:  None  Antimicrobials:  IV vancomycin and Zosyn 10/21-10/22  DVT prophylaxis: Heparin discontinued now that she is comfort care   Objective: Vitals:   Jun 30, 2018 0732 June 30, 2018 1200  BP:    Pulse: 96 98  Resp: 14 (!) 9  Temp:    SpO2: 93% (!) 68%    Intake/Output Summary (Last 24 hours) at Jun 30, 2018 1238 Last data filed at 06-30-2018 1201 Gross per 24 hour  Intake 1727.67 ml  Output 500 ml  Net 1227.67 ml   Filed Weights   06/20/18 0002  Weight:  60.8 kg   Body mass index is 21.63 kg/m.  Exam:   General: Intermittently awake, somewhat interactive  HEENT: Normocephalic and atraumatic, mucous memories are dry  Neck: Supple, no JVD  Cardiovascular: Irregular rhythm, rate controlled  Respiratory: Decreased breath sounds throughout     Data Reviewed: CBC: Recent Labs  Lab  06/15/2018 2335 06/20/18 0304  WBC 4.0 4.7  NEUTROABS 3.1  --   HGB 10.3* 10.1*  HCT 32.6* 32.0*  MCV 99.7 98.2  PLT 176 148*   Basic Metabolic Panel: Recent Labs  Lab 06/25/2018 2335 06/20/18 0304  NA 130* 132*  K 3.9 3.7  CL 101 103  CO2 21* 22  GLUCOSE 201* 112*  BUN 17 15  CREATININE 1.04* 1.01*  CALCIUM 7.7* 7.6*   GFR: Estimated Creatinine Clearance: 43.7 mL/min (A) (by C-G formula based on SCr of 1.01 mg/dL (H)). Liver Function Tests: Recent Labs  Lab 06/22/2018 2335 06/20/18 0304  AST 114* 108*  ALT 73* 67*  ALKPHOS 128* 124  BILITOT 1.0 1.0  PROT 6.5 6.3*  ALBUMIN 2.9* 2.7*   No results for input(s): LIPASE, AMYLASE in the last 168 hours. Recent Labs  Lab 06/29/2018 2335  AMMONIA 49*   Coagulation Profile: Recent Labs  Lab 06/20/18 0304  INR 1.94   Cardiac Enzymes: No results for input(s): CKTOTAL, CKMB, CKMBINDEX, TROPONINI in the last 168 hours. BNP (last 3 results) No results for input(s): PROBNP in the last 8760 hours. HbA1C: No results for input(s): HGBA1C in the last 72 hours. CBG: Recent Labs  Lab 06/20/18 1705 06/20/18 1723 06/20/18 1802 06/20/18 1818 2018/07/12 0820  GLUCAP 70 43* 41* 30* 84   Lipid Profile: No results for input(s): CHOL, HDL, LDLCALC, TRIG, CHOLHDL, LDLDIRECT in the last 72 hours. Thyroid Function Tests: Recent Labs    06/20/18 0304  TSH 2.446   Anemia Panel: Recent Labs    06/20/18 0304  VITAMINB12 2,103*   Urine analysis:    Component Value Date/Time   COLORURINE STRAW (A) 03/25/2018 2113   APPEARANCEUR CLEAR 03/25/2018 2113   LABSPEC 1.004 (L) 03/25/2018 2113   PHURINE 8.0 03/25/2018 2113   GLUCOSEU NEGATIVE 03/25/2018 2113   HGBUR NEGATIVE 03/25/2018 2113   BILIRUBINUR NEGATIVE 03/25/2018 2113   KETONESUR NEGATIVE 03/25/2018 2113   PROTEINUR NEGATIVE 03/25/2018 2113   NITRITE NEGATIVE 03/25/2018 2113   LEUKOCYTESUR NEGATIVE 03/25/2018 2113   Sepsis  Labs: @LABRCNTIP (procalcitonin:4,lacticidven:4)  )No results found for this or any previous visit (from the past 240 hour(s)).    Studies: No results found.  Scheduled Meds: .  morphine injection  2 mg Intravenous Once    Continuous Infusions: . dextrose 5 mL/hr at July 12, 2018 1201  . midazolam (VERSED) infusion 0.5 mg/hr (07-12-18 1157)  . morphine 5 mg/hr (July 12, 2018 1158)     LOS: 2 days     Hollice Espy, MD [facility]  To reach me or the doctor on call, go to: [email or web address] Password [facility]  07-12-18, 12:38 PM

## 2018-06-30 NOTE — Progress Notes (Signed)
Pt's daughter, Lorrene Reid dropped off FMLA forms fo rto be completed so she can miss work as needed to take care of her mother.  Spoke w/her and advised forms completed and signed by Dr Gala Romney, she request forms be faxed to her employer at [phone] atten Maryann Alar, forms faxed

## 2018-06-30 DEATH — deceased

## 2018-07-30 NOTE — Discharge Summary (Signed)
Death Summary  [name] BolognaDeanne Rosales ONG:[id number] DOB: 1941/08/27 DOA: 06/08/2018  PCP: System, Pcp Not In  Admit date: 06/05/2018 Date of Death: 09/02/2017 Time of Death: 11:28pm   History of present illness:  77 y.o.femalew/ a hx ofHTN, CAD s/p RCA PCI 03/27/18, systolic CHF, LBBB, Afib/Flutter, myclonic seizure, radial artery PSA s/p repair 04/26/18, and OSAwho [facility] [place] 06[place] for AMS aftershepresented to cardiac rehab cyanotic and hypoglycemic. On admission there, Cr 1.19, BUN 41, Na 120, and lactate 4.7. UA negative. Glucose 28 initially. Admitted for acute hypoxic respiratory failure and started on Vanc/Cefepime. CT scan showed pulmonary edema but no PE.Troponins increased from 0.018 ->0.038 and family [facility], where she arrived on 10/21.  Following transfer, patient remained lethargic for the past 24-48 hours with intermittent episodes of severe recurrent hypoglycemia on 10/22 as well as episodes of being obtunded, hypoventilation with hypoxia.  Patient remained air hungry and despite use of nonrebreather and multiple amps of D50, patient continued to have hypoxia and hypoglycemia.  On evening of 10/22, family had discussion with attending physician with decision for patient to be made comfortable & she was started on morphine drip.  She passed away on the night of 11/23.  Final Diagnoses:    Toxic metabolic encephalopathy Active Problems:   Acute on chronic systolic heart failure (HCC)   Coronary artery disease involving native coronary artery of native heart without angina pectoris   PAF (paroxysmal atrial fibrillation) (HCC)   Severe hypertension   Sepsis (HCC)     The results of significant diagnostics from this hospitalization (including imaging, microbiology, ancillary and laboratory) are listed below for reference.    Significant Diagnostic Studies: Dg Chest Port 1 View  Result Date: 06/20/2018 CLINICAL DATA:   Heart failure, fever EXAM: PORTABLE CHEST 1 VIEW COMPARISON:  03/24/2018, CT chest 03/29/2018 FINDINGS: Small left greater than right pleural effusions. Cardiomegaly with vascular congestion and diffuse interstitial edema. Consolidation at the left base. Aortic atherosclerosis. No pneumothorax. IMPRESSION: 1. Cardiomegaly with vascular congestion and mild interstitial edema. Small left greater than right pleural effusions. 2. Nausea dense left lung base consolidation, atelectasis versus pneumonia Electronically Signed   By: Jasmine PangKim  Fujinaga M.D.   On: 06/13/2018 23:14    Microbiology: No results found for this or any previous visit (from the past 240 hour(s)).   Labs: Basic Metabolic Panel: No results for input(s): NA, K, CL, CO2, GLUCOSE, BUN, CREATININE, CALCIUM, MG, PHOS in the last 168 hours. Liver Function Tests: No results for input(s): AST, ALT, ALKPHOS, BILITOT, PROT, ALBUMIN in the last 168 hours. No results for input(s): LIPASE, AMYLASE in the last 168 hours. No results for input(s): AMMONIA in the last 168 hours. CBC: No results for input(s): WBC, NEUTROABS, HGB, HCT, MCV, PLT in the last 168 hours. Cardiac Enzymes: No results for input(s): CKTOTAL, CKMB, CKMBINDEX, TROPONINI in the last 168 hours. D-Dimer No results for input(s): DDIMER in the last 72 hours. BNP: Invalid input(s): POCBNP CBG: No results for input(s): GLUCAP in the last 168 hours. Anemia work up No results for input(s): VITAMINB12, FOLATE, FERRITIN, TIBC, IRON, RETICCTPCT in the last 72 hours. Urinalysis    Component Value Date/Time   COLORURINE STRAW (A) 03/25/2018 2113   APPEARANCEUR CLEAR 03/25/2018 2113   LABSPEC 1.004 (L) 03/25/2018 2113   PHURINE 8.0 03/25/2018 2113   GLUCOSEU NEGATIVE 03/25/2018 2113   HGBUR NEGATIVE 03/25/2018 2113   BILIRUBINUR NEGATIVE 03/25/2018 2113   KETONESUR NEGATIVE 03/25/2018 2113  PROTEINUR NEGATIVE 03/25/2018 2113   NITRITE NEGATIVE 03/25/2018 2113   LEUKOCYTESUR  NEGATIVE 03/25/2018 2113   Sepsis Labs Invalid input(s): PROCALCITONIN,  WBC,  LACTICIDVEN     SIGNED:  Hollice Espy, MD  [facility] 07/11/2018, 4:52 PM Pager   If 7PM-7AM, please contact night-coverage [email or web address] Password [email or web address]

## 2018-09-06 ENCOUNTER — Ambulatory Visit: Payer: Medicare PPO | Admitting: Neurology

## 2019-01-01 IMAGING — CT CT ABD-PELV W/ CM
2 of 5 series · 13 of 46 positions shown, 15 images · IV contrast (APPLIED)
Comparison: None.

CLINICAL DATA: Pulmonary hypertension shortness of breath.
Paraneoplastic syndrome.

EXAM:
CT CHEST, ABDOMEN, AND PELVIS WITH CONTRAST
TECHNIQUE: Multidetector CT imaging of the chest, abdomen and pelvis was
performed following the standard protocol during bolus
administration of intravenous contrast.
CONTRAST:  100mL OMNIPAQUE IOHEXOL 300 MG/ML  SOLN

[Series 3: cap 5.0 i31f 2 · axial · 0.84mm/px · z∈[+798,+1303]mm · 10 of 125 slices shown, 12 images]
[im 12/125  soft-tissue]
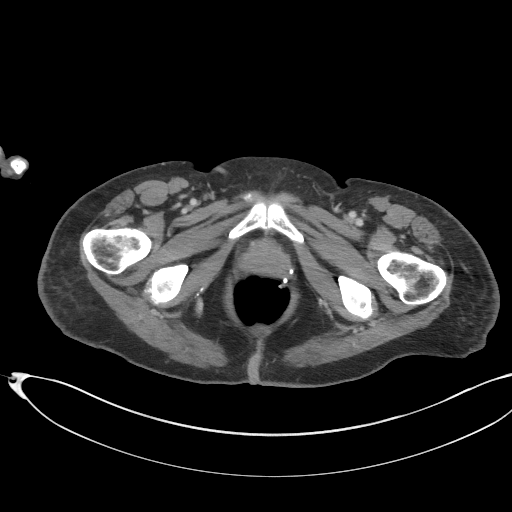
[im 12/125  bone]
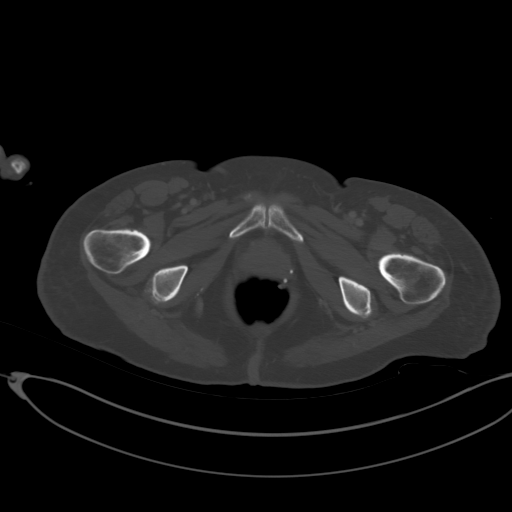
[im 23/125  soft-tissue]
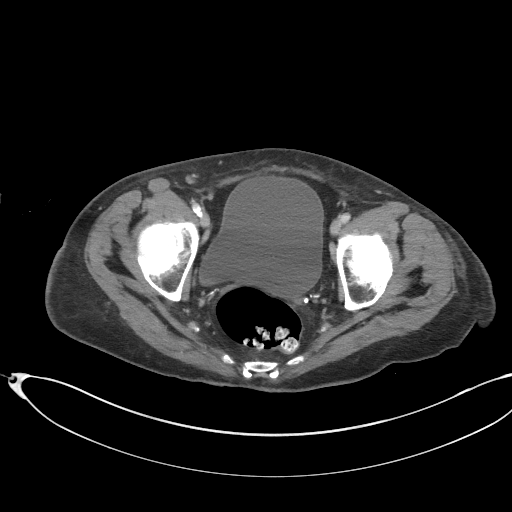
[im 34/125  soft-tissue]
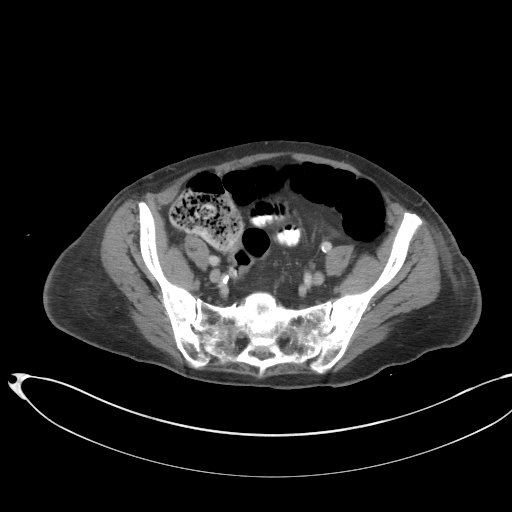
[im 46/125  soft-tissue]
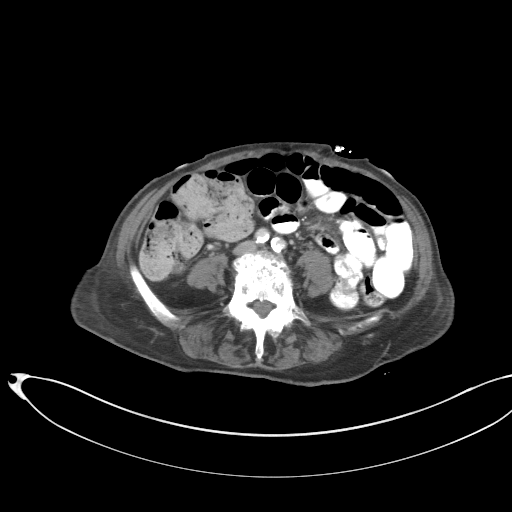
[im 57/125  soft-tissue]
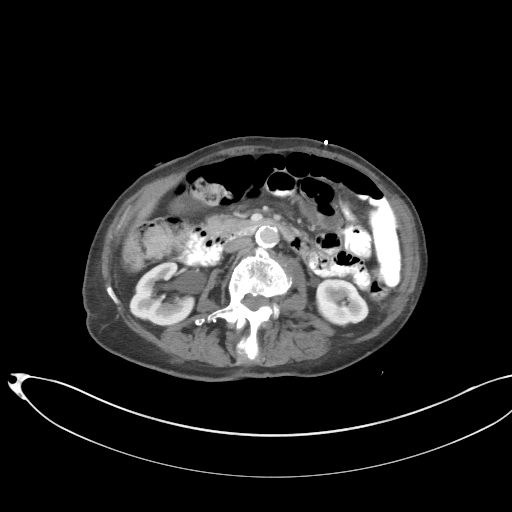
[im 68/125  soft-tissue]
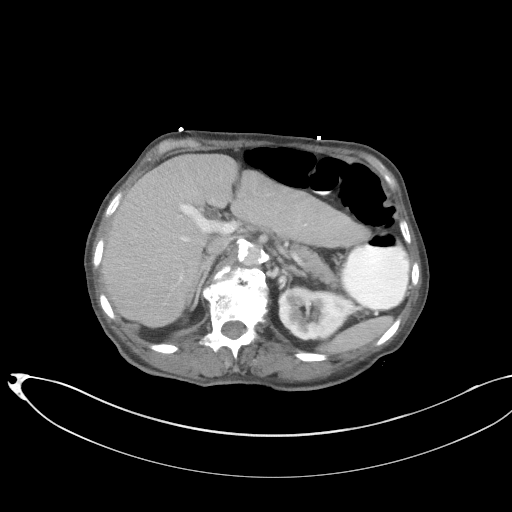
[im 79/125  soft-tissue]
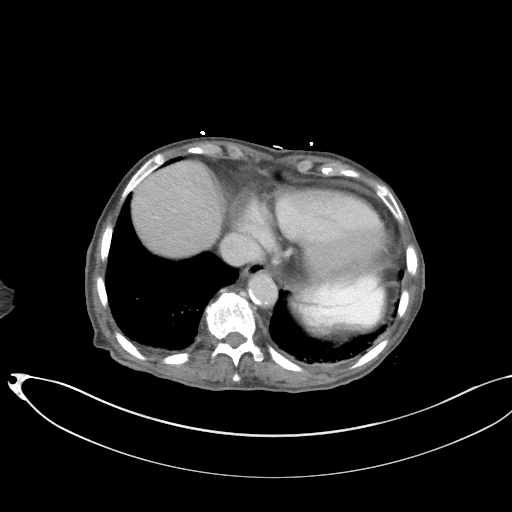
[im 91/125  soft-tissue]
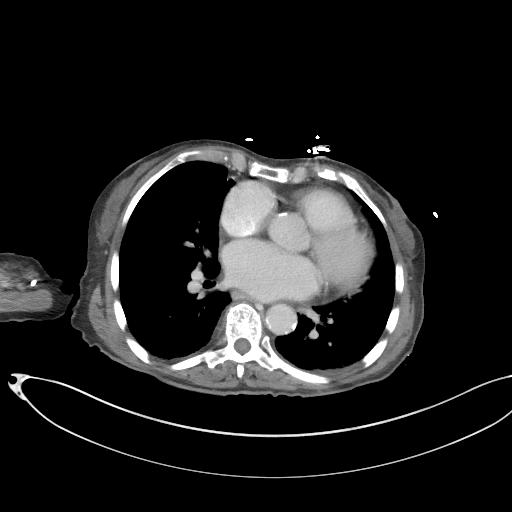
[im 102/125  soft-tissue]
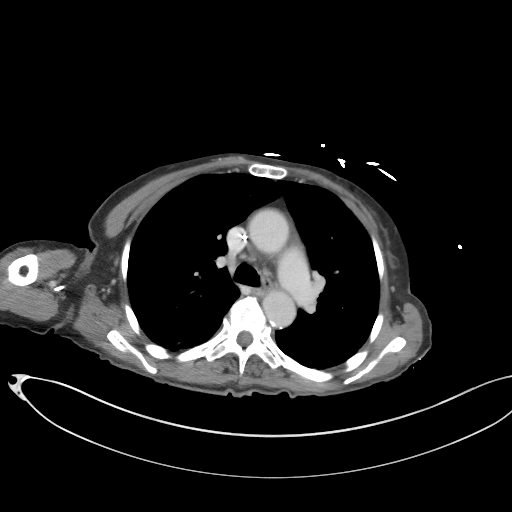
[im 102/125  bone]
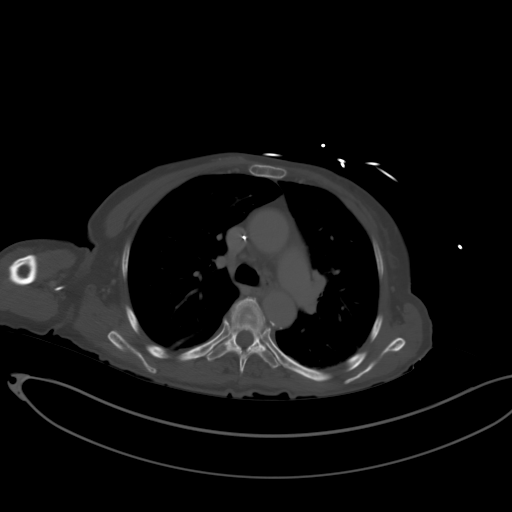
[im 113/125  soft-tissue]
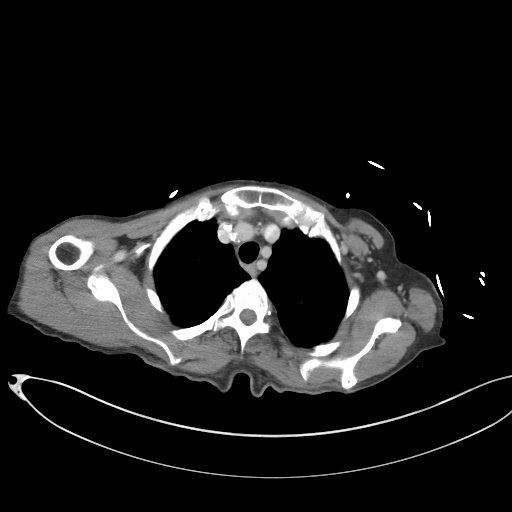

[Series 6: coronal · coronal · 0.80mm/px · 3 of 120 slices shown]
[im 40/120  soft-tissue]
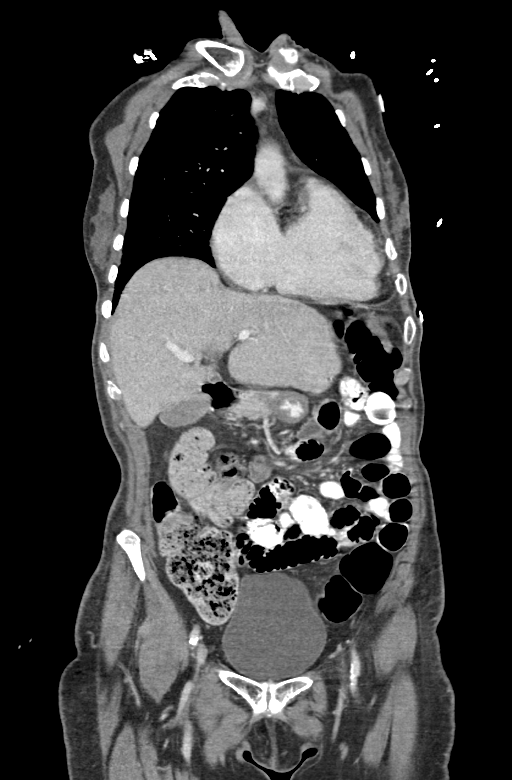
[im 53/120  soft-tissue]
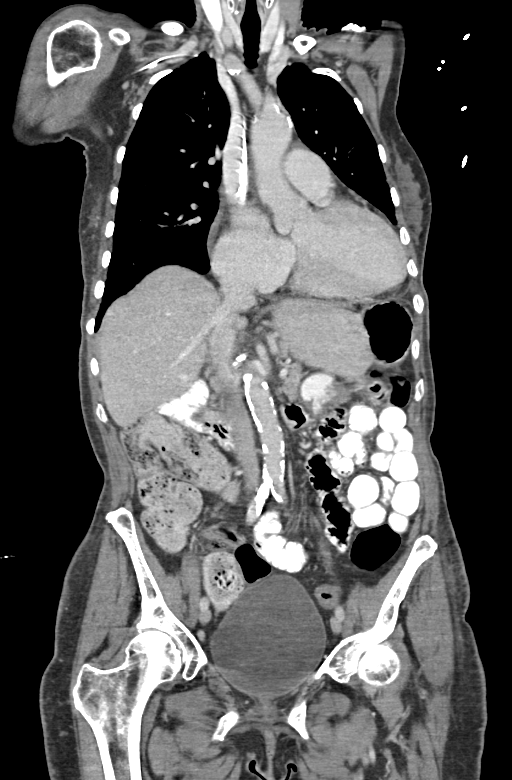
[im 67/120  soft-tissue]
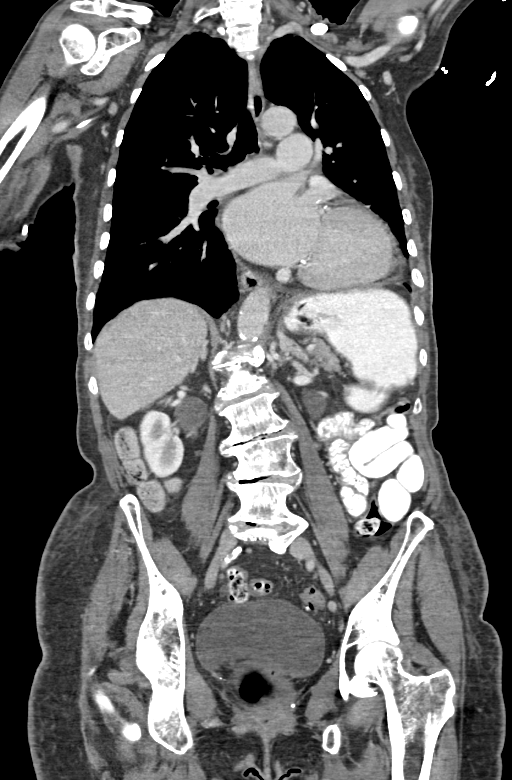

[13 of 46 positions shown; findings below may reference images not displayed]

FINDINGS: CT CHEST FINDINGS

Cardiovascular: Heart is markedly enlarged. Trace pericardial
effusion evident. Coronary artery calcification is evident.
Atherosclerotic calcification is noted in the wall of the thoracic
aorta. Right PICC line tip is positioned in the distal SVC near the
junction with the RA.

Mediastinum/Nodes: No mediastinal lymphadenopathy. There is no hilar
lymphadenopathy. The esophagus has normal imaging features. There is
no axillary lymphadenopathy.

Lungs/Pleura: The central tracheobronchial airways are patent.
Centrilobular and paraseptal emphysema noted. Atelectasis or
scarring noted in the right middle lobe and lingula. Dependent
atelectasis evident in the lungs bilaterally. No suspicious
pulmonary nodule or mass. No pleural effusion.

Musculoskeletal: No worrisome lytic or sclerotic osseous
abnormality.

CT ABDOMEN PELVIS FINDINGS

Hepatobiliary: No focal abnormality within the liver parenchyma.
There is no evidence for gallstones, gallbladder wall thickening, or
pericholecystic fluid. No intrahepatic or extrahepatic biliary
dilation.

Pancreas: No focal mass lesion. No dilatation of the main duct. No
intraparenchymal cyst. No peripancreatic edema.

Spleen: No splenomegaly. No focal mass lesion.

Adrenals/Urinary Tract: No adrenal nodule or mass. Each kidney
demonstrates prominent extrarenal pelvis. 4 mm low-density lesion
upper pole right kidney is too small to characterize. No
hydroureter. The urinary bladder appears normal for the degree of
distention.

Stomach/Bowel: Stomach is nondistended. No gastric wall thickening.
No evidence of outlet obstruction. Duodenum is normally positioned
as is the ligament of Treitz. No small bowel wall thickening. No
small bowel dilatation. The terminal ileum is normal. The appendix
is not visualized, but there is no edema or inflammation in the
region of the cecum. No gross colonic mass. No colonic wall
thickening. No substantial diverticular change.

Vascular/Lymphatic: There is abdominal aortic atherosclerosis
without aneurysm. Portal vein and superior mesenteric vein are
patent. There is no gastrohepatic or hepatoduodenal ligament
lymphadenopathy. No intraperitoneal or retroperitoneal
lymphadenopathy. Small lymph nodes are seen along each pelvic
sidewall without lymphadenopathy.

Reproductive: Uterus surgically absent.  There is no adnexal mass.

Other: No intraperitoneal free fluid.

Musculoskeletal: No worrisome lytic or sclerotic osseous
abnormality. Advanced degenerative changes are noted in the lower
thoracic and lumbar spine sclerotic focus in the L1 vertebral body
is likely a bone island.
IMPRESSION: 1. No findings in the chest, abdomen, or pelvis to raise concern for
malignancy.
2.  Emphysema. ([id number])
3.  Aortic Atherosclerois (UEFJF-170.0)
4. Marked degenerative disc disease in the lower thoracic and lumbar
spine.

## 2019-03-24 IMAGING — DX DG CHEST 1V PORT
1 series · 1 of 1 positions shown · non-contrast
Comparison: 03/24/2018, CT chest 03/29/2018

CLINICAL DATA: Heart failure, fever

EXAM:
PORTABLE CHEST 1 VIEW

[chest]
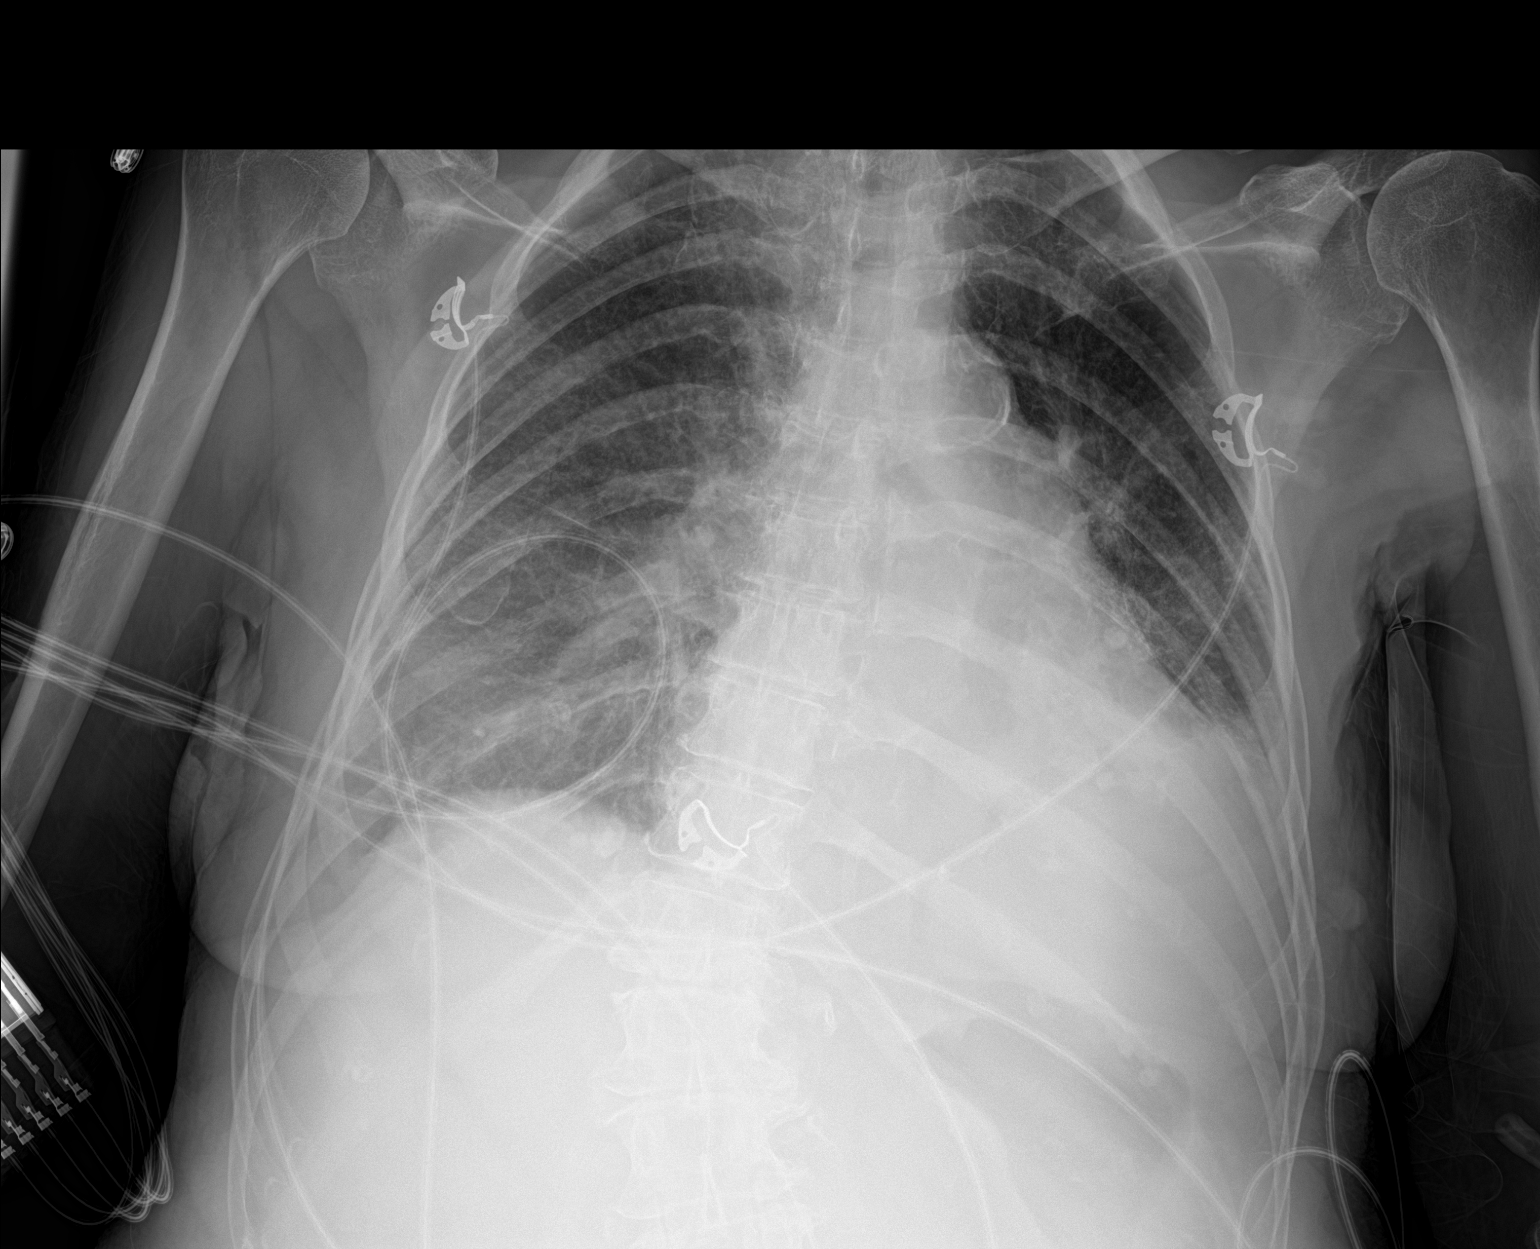

[1 of 1 positions shown; findings below may reference images not displayed]

FINDINGS: Small left greater than right pleural effusions. Cardiomegaly with
vascular congestion and diffuse interstitial edema. Consolidation at
the left base. Aortic atherosclerosis. No pneumothorax.
IMPRESSION: 1. Cardiomegaly with vascular congestion and mild interstitial
edema. Small left greater than right pleural effusions.
2. Nausea dense left lung base consolidation, atelectasis versus
pneumonia
